# Patient Record
Sex: Female | Born: 1990 | Race: Black or African American | Hispanic: No | Marital: Single
Health system: Midwestern US, Community
[De-identification: ages and names within clinical notes are randomized; demographics above are authoritative.]

## PROBLEM LIST (undated history)

## (undated) DIAGNOSIS — K429 Umbilical hernia without obstruction or gangrene: Secondary | ICD-10-CM

---

## 2013-01-25 ENCOUNTER — Emergency Department (HOSPITAL_COMMUNITY)
Admission: EM | Admit: 2013-01-25 | Discharge: 2013-01-25 | Disposition: A | Discharge status: Home / Self Care | Payer: Medicaid Other | Source: Home / Self Care | Attending: Family Medicine | Admitting: Family Medicine

## 2013-01-25 ENCOUNTER — Encounter (HOSPITAL_COMMUNITY): Payer: Self-pay | Admitting: *Deleted

## 2013-01-25 DIAGNOSIS — H6120 Impacted cerumen, unspecified ear: Secondary | ICD-10-CM

## 2013-01-25 DIAGNOSIS — H918X9 Other specified hearing loss, unspecified ear: Secondary | ICD-10-CM

## 2013-01-25 NOTE — ED Notes (Signed)
Pt  Reports  A  Sensation  Of  R  Earache        X      3  Days      With    Sensation of  Decreased   Hearing         Pt  denys  Any  Other  Symptoms

## 2013-01-25 NOTE — ED Provider Notes (Signed)
History     CSN: [id number]  Arrival date & time 01/25/13  1400   First MD Initiated Contact with Patient 01/25/13 1402      Chief Complaint  Patient presents with  . Otalgia    (Consider location/radiation/quality/duration/timing/severity/associated sxs/prior treatment) Patient is a 22 y.o. female presenting with ear pain. The history is provided by the patient.  Otalgia Location:  Right Behind ear:  No abnormality Quality:  Pressure Severity:  Mild Onset quality:  Sudden Duration:  3 days Progression:  Unchanged Chronicity:  New Associated symptoms: hearing loss   Associated symptoms: no ear discharge   Risk factors: no chronic ear infection     History reviewed. No pertinent past medical history.  History reviewed. No pertinent past surgical history.  No family history on file.  History  Substance Use Topics  . Smoking status: Never Smoker   . Smokeless tobacco: Not on file  . Alcohol Use: No    OB History   Grav Para Term Preterm Abortions TAB SAB Ect Mult Living                  Review of Systems  Constitutional: Negative.   HENT: Positive for hearing loss and ear pain. Negative for ear discharge.     Allergies  Review of patient's allergies indicates no known allergies.  Home Medications  No current outpatient prescriptions on file.  BP 122/80  Pulse 70  Temp(Src) 98.6 F (37 C) (Oral)  Resp 18  SpO2 100%  LMP 01/04/2013  Physical Exam  Nursing note and vitals reviewed. Constitutional: She appears well-developed and well-nourished.  HENT:  Head: Normocephalic.  Left Ear: External ear normal.  Ears:  Mouth/Throat: Oropharynx is clear and moist.    ED Course  Procedures (including critical care time)  Labs Reviewed - No data to display No results found.   1. Hearing loss secondary to cerumen impaction       MDM   sx relieved after ear irrig. Tm nl canal.        Linna Hoff, MD 01/25/13 [phone]

## 2013-09-04 ENCOUNTER — Inpatient Hospital Stay (HOSPITAL_COMMUNITY)
Admission: EM | Admit: 2013-09-04 | Discharge: 2013-09-05 | DRG: 208 | Disposition: A | Discharge status: Home / Self Care | Payer: Medicaid Other | Attending: Internal Medicine | Admitting: Internal Medicine

## 2013-09-04 ENCOUNTER — Encounter (HOSPITAL_COMMUNITY): Payer: Self-pay | Admitting: Emergency Medicine

## 2013-09-04 ENCOUNTER — Emergency Department (HOSPITAL_COMMUNITY): Payer: Medicaid Other

## 2013-09-04 DIAGNOSIS — F1010 Alcohol abuse, uncomplicated: Secondary | ICD-10-CM | POA: Diagnosis present

## 2013-09-04 DIAGNOSIS — N179 Acute kidney failure, unspecified: Secondary | ICD-10-CM | POA: Diagnosis present

## 2013-09-04 DIAGNOSIS — J9600 Acute respiratory failure, unspecified whether with hypoxia or hypercapnia: Secondary | ICD-10-CM

## 2013-09-04 DIAGNOSIS — Y92009 Unspecified place in unspecified non-institutional (private) residence as the place of occurrence of the external cause: Secondary | ICD-10-CM

## 2013-09-04 DIAGNOSIS — F1210 Cannabis abuse, uncomplicated: Secondary | ICD-10-CM | POA: Diagnosis present

## 2013-09-04 DIAGNOSIS — R4020 Unspecified coma: Secondary | ICD-10-CM

## 2013-09-04 DIAGNOSIS — F1910 Other psychoactive substance abuse, uncomplicated: Secondary | ICD-10-CM | POA: Diagnosis present

## 2013-09-04 DIAGNOSIS — G9340 Encephalopathy, unspecified: Secondary | ICD-10-CM | POA: Diagnosis present

## 2013-09-04 DIAGNOSIS — IMO0002 Reserved for concepts with insufficient information to code with codable children: Secondary | ICD-10-CM

## 2013-09-04 DIAGNOSIS — W08XXXA Fall from other furniture, initial encounter: Secondary | ICD-10-CM | POA: Diagnosis present

## 2013-09-04 DIAGNOSIS — F10929 Alcohol use, unspecified with intoxication, unspecified: Secondary | ICD-10-CM

## 2013-09-04 DIAGNOSIS — J9601 Acute respiratory failure with hypoxia: Secondary | ICD-10-CM

## 2013-09-04 DIAGNOSIS — E876 Hypokalemia: Secondary | ICD-10-CM | POA: Diagnosis present

## 2013-09-04 DIAGNOSIS — R4182 Altered mental status, unspecified: Secondary | ICD-10-CM | POA: Diagnosis present

## 2013-09-04 LAB — CBC WITH DIFFERENTIAL/PLATELET
Basophils Relative: 0 % (ref 0–1)
HCT: 42 % (ref 36.0–46.0)
Hemoglobin: 15.1 g/dL — ABNORMAL HIGH (ref 12.0–15.0)
Lymphocytes Relative: 44 % (ref 12–46)
MCHC: 36 g/dL (ref 30.0–36.0)
Monocytes Absolute: 0.3 10*3/uL (ref 0.1–1.0)
Monocytes Relative: 3 % (ref 3–12)
Neutro Abs: 5.5 10*3/uL (ref 1.7–7.7)
Neutrophils Relative %: 52 % (ref 43–77)
RBC: 4.41 MIL/uL (ref 3.87–5.11)
WBC: 10.6 10*3/uL — ABNORMAL HIGH (ref 4.0–10.5)

## 2013-09-04 LAB — SALICYLATE LEVEL: Salicylate Lvl: <2 mg/dL — ABNORMAL LOW (ref 2.8–20.0)

## 2013-09-04 LAB — POCT PREGNANCY, URINE: Preg Test, Ur: NEGATIVE

## 2013-09-04 LAB — URINALYSIS, ROUTINE W REFLEX MICROSCOPIC
Hgb urine dipstick: NEGATIVE
Leukocytes, UA: NEGATIVE
Nitrite: NEGATIVE
Protein, ur: NEGATIVE mg/dL
Specific Gravity, Urine: 1.019 (ref 1.005–1.030)
Urobilinogen, UA: 0.2 mg/dL (ref 0.0–1.0)

## 2013-09-04 LAB — ACETAMINOPHEN LEVEL: Acetaminophen (Tylenol), Serum: <15 ug/mL (ref 10–30)

## 2013-09-04 LAB — COMPREHENSIVE METABOLIC PANEL
ALT: 10 U/L (ref 0–35)
Albumin: 3.3 g/dL — ABNORMAL LOW (ref 3.5–5.2)
Alkaline Phosphatase: 37 U/L — ABNORMAL LOW (ref 39–117)
BUN: 10 mg/dL (ref 6–23)
Chloride: 110 mEq/L (ref 96–112)
Glucose, Bld: 97 mg/dL (ref 70–99)
Potassium: 3.2 mEq/L — ABNORMAL LOW (ref 3.5–5.1)
Sodium: 144 mEq/L (ref 135–145)
Total Bilirubin: 0.1 mg/dL — ABNORMAL LOW (ref 0.3–1.2)
Total Protein: 6 g/dL (ref 6.0–8.3)

## 2013-09-04 LAB — BLOOD GAS, ARTERIAL
Bicarbonate: 20.9 mEq/L (ref 20.0–24.0)
PEEP: 5 cmH2O
TCO2: 19.3 mmol/L (ref 0–100)
pCO2 arterial: 54.6 mmHg — ABNORMAL HIGH (ref 35.0–45.0)
pH, Arterial: 7.208 — ABNORMAL LOW (ref 7.350–7.450)
pO2, Arterial: 375 mmHg — ABNORMAL HIGH (ref 80.0–100.0)

## 2013-09-04 LAB — RAPID URINE DRUG SCREEN, HOSP PERFORMED
Amphetamines: NOT DETECTED
Tetrahydrocannabinol: POSITIVE — AB

## 2013-09-04 LAB — ETHANOL: Alcohol, Ethyl (B): 407 mg/dL (ref 0–11)

## 2013-09-04 LAB — OSMOLALITY: Osmolality: 379 mOsm/kg (ref 275–300)

## 2013-09-04 MED ORDER — MORPHINE SULFATE 4 MG/ML IJ SOLN
INTRAMUSCULAR | Status: AC
  Filled 2013-09-04: qty 1

## 2013-09-04 MED ORDER — DEXTROSE-NACL 5-0.45 % IV SOLN
INTRAVENOUS | Status: DC
  Administered 2013-09-04 – 2013-09-05 (×2): via INTRAVENOUS

## 2013-09-04 MED ORDER — PROPOFOL 10 MG/ML IV EMUL
5.0000 ug/kg/min | Freq: Once | INTRAVENOUS | Status: AC
  Administered 2013-09-04: 5 ug/kg/min via INTRAVENOUS
  Filled 2013-09-04: qty 100

## 2013-09-04 MED ORDER — NALOXONE HCL 1 MG/ML IJ SOLN
INTRAMUSCULAR | Status: AC
  Filled 2013-09-04: qty 2

## 2013-09-04 MED ORDER — MIDAZOLAM HCL 2 MG/2ML IJ SOLN
5.0000 mg | Freq: Once | INTRAMUSCULAR | Status: DC
  Filled 2013-09-04: qty 6

## 2013-09-04 MED ORDER — NALOXONE HCL 1 MG/ML IJ SOLN
2.0000 mg | Freq: Once | INTRAMUSCULAR | Status: AC
  Administered 2013-09-04: 2 mg via INTRAVENOUS

## 2013-09-04 MED ORDER — MORPHINE SULFATE 4 MG/ML IJ SOLN
4.0000 mg | Freq: Once | INTRAMUSCULAR | Status: AC
  Administered 2013-09-04: 4 mg via INTRAVENOUS

## 2013-09-04 MED ORDER — LORAZEPAM 2 MG/ML IJ SOLN
4.0000 mg | Freq: Once | INTRAMUSCULAR | Status: AC
  Administered 2013-09-04: 4 mg via INTRAVENOUS

## 2013-09-04 MED ORDER — SODIUM CHLORIDE 0.9 % IV SOLN: 250.0000 mL | INTRAVENOUS | Status: DC | PRN

## 2013-09-04 MED ORDER — PANTOPRAZOLE SODIUM 40 MG IV SOLR: 40.0000 mg | Freq: Every day | INTRAVENOUS | Status: DC

## 2013-09-04 MED ORDER — POTASSIUM CHLORIDE 10 MEQ/100ML IV SOLN
10.0000 meq | INTRAVENOUS | Status: AC
  Administered 2013-09-04 (×4): 10 meq via INTRAVENOUS
  Filled 2013-09-04 (×4): qty 100

## 2013-09-04 MED ORDER — NALOXONE HCL 1 MG/ML IJ SOLN: 2.0000 mg | Freq: Once | INTRAMUSCULAR | Status: DC

## 2013-09-04 MED ORDER — MENTHOL 3 MG MT LOZG
1.0000 | LOZENGE | OROMUCOSAL | Status: DC | PRN
  Administered 2013-09-04: 3 mg via ORAL
  Filled 2013-09-04: qty 9

## 2013-09-04 MED ORDER — LORAZEPAM 2 MG/ML IJ SOLN
INTRAMUSCULAR | Status: AC
  Filled 2013-09-04: qty 2

## 2013-09-04 MED ORDER — HEPARIN SODIUM (PORCINE) 5000 UNIT/ML IJ SOLN
5000.0000 [IU] | Freq: Three times a day (TID) | INTRAMUSCULAR | Status: DC
  Administered 2013-09-04 – 2013-09-05 (×3): 5000 [IU] via SUBCUTANEOUS
  Filled 2013-09-04 (×8): qty 1

## 2013-09-04 NOTE — ED Provider Notes (Addendum)
CSN: [id number]     Arrival date & time 09/04/13  0153 History   First MD Initiated Contact with Patient 09/04/13 0156     Chief Complaint  Patient presents with  . Alcohol Intoxication   (Consider location/radiation/quality/duration/timing/severity/associated sxs/prior Treatment) HPI Comments: LEVEL 5 CAVEAT FOR ALTERED MENTAL STATUS. Pt comes in with cc of fall. Per EMS, family called EMS after she fel from couch onto a the floor - and wouldn't move. Family immersed patient in a bath, and she still didn't respond, so they called EMS> Pt was drinking liquor prior to episode. Family denies any hx of drug abuse and denies any medical problems that they are aware of.   Patient is a 22 y.o. female presenting with intoxication. The history is provided by the EMS personnel and a relative.  Alcohol Intoxication    History reviewed. No pertinent past medical history. History reviewed. No pertinent past surgical history. History reviewed. No pertinent family history. History  Substance Use Topics  . Smoking status: Never Smoker   . Smokeless tobacco: Not on file  . Alcohol Use: Yes   OB History   Grav Para Term Preterm Abortions TAB SAB Ect Mult Living                 Review of Systems  Unable to perform ROS: Patient unresponsive    Allergies  Review of patient's allergies indicates no known allergies.  Home Medications  No current outpatient prescriptions on file. BP 102/78  Pulse 110  Resp 23  Ht 5\' 5"  (1.651 m)  Wt 120 lb (54.432 kg)  BMI 19.97 kg/m2  SpO2 100% Physical Exam  Nursing note and vitals reviewed. Constitutional: She appears well-developed.  HENT:  Head: Atraumatic.  Eyes:  Pinpoint, no saccadic movement, or nystagmus  Cardiovascular: Normal rate.   Pulmonary/Chest: Effort normal.  Abdominal: Soft.  Neurological:  GCS - 1/1/3 (e/v/m) = 5. No gag  Skin: Skin is warm.    ED Course  Gastrostomy tube replacement Date/Time: 09/04/2013 2:43  AM Performed by: Derwood Kaplan Authorized by: Derwood Kaplan Consent: The procedure was performed in an emergent situation. Patient identity confirmed: arm band Time out: Immediately prior to procedure a "time out" was called to verify the correct patient, procedure, equipment, support staff and site/side marked as required. Preparation: Patient was prepped and draped in the usual sterile fashion. Local anesthesia used: no Patient tolerance: Patient tolerated the procedure well with no immediate complications. Comments: OROGASTRIC TUBE PLACED POST INTUBATION   (including critical care time) Labs Review Labs Reviewed  CBC WITH DIFFERENTIAL  TROPONIN I  COMPREHENSIVE METABOLIC PANEL  URINE RAPID DRUG SCREEN (HOSP PERFORMED)  ACETAMINOPHEN LEVEL  SALICYLATE LEVEL  ETHANOL  BLOOD GAS, ARTERIAL  OSMOLALITY   Imaging Review No results found.  EKG Interpretation    Date/Time:  Sunday September 04 2013 02:21:14 EST Ventricular Rate:  116 PR Interval:  171 QRS Duration: 59 QT Interval:  322 QTC Calculation: 447 R Axis:   88 Text Interpretation:  Sinus tachycardia Abnormal Q suggests anterior infarct Confirmed by Xavier Munger, MD, Anacristina Steffek ([id number]) on 09/04/2013 2:35:50 AM            MDM  No diagnosis found.   Date: 09/04/2013  Rate: 116  Rhythm: sinus tachycardia  QRS Axis: normal  Intervals: normal  ST/T Wave abnormalities: nonspecific ST/T changes  Conduction Disutrbances:none  Narrative Interpretation:   Old EKG Reviewed: none available   DDx includes: ICH Stroke ACS Electrolyte abnormality Drug overdose Metabolic  disorders including thyroid disorders, adrenal insufficiency Hypercapnia Hypoxia  Pt comes in comatose. No medical hx. Reported moderate alcohol consumption. No drug abuse. Pt at arrival had pin point pupils - 2 mg narcan x 2 given - no response. Pt was intubated.   INTUBATION Performed by: Derwood Kaplan  Required items: required blood  products, implants, devices, and special equipment available Patient identity confirmed: provided demographic data and hospital-assigned identification number Time out: Immediately prior to procedure a "time out" was called to verify the correct patient, procedure, equipment, support staff and site/side marked as required.  Indications: Airway Protection  Intubation method: Direct Laryngoscopy   Preoxygenation: BVM  Sedatives: 15 mg Etomidate Paralytic: 80 mg Succinylcholine  Tube Size: 7 cuffed  Post-procedure assessment: chest rise and ETCO2 monitor Breath sounds: equal and absent over the epigastrium Tube secured with: ETT holder Chest x-ray interpreted by radiologist and me.  Chest x-ray findings: endotracheal tube in appropriate position  Patient tolerated the procedure well with no immediate complications.      Derwood Kaplan, MD 09/04/13 0244  CRITICAL CARE Performed by: Derwood Kaplan   Total critical care time: 35 minutes  Critical care time was exclusive of separately billable procedures and treating other patients.  Critical care was necessary to treat or prevent imminent or life-threatening deterioration.  Critical care was time spent personally by me on the following activities: development of treatment plan with patient and/or surrogate as well as nursing, discussions with consultants, evaluation of patient's response to treatment, examination of patient, obtaining history from patient or surrogate, ordering and performing treatments and interventions, ordering and review of laboratory studies, ordering and review of radiographic studies, pulse oximetry and re-evaluation of patient's condition.   Derwood Kaplan, MD 09/04/13 [phone]

## 2013-09-04 NOTE — H&P (Signed)
PULMONARY  / CRITICAL CARE MEDICINE  Name: Angelica Cummings MRN: [id number] DOB: August 10, 1991    ADMISSION DATE:  09/04/2013  CHIEF COMPLAINT:   Alcohol intoxication Respiratory failure.   BRIEF PATIENT DESCRIPTION:  22 years old female with no significant PMH. Presents with AMS secondary to alcohol intoxication and hypercarbic respiratory failure requiring intubation.   SIGNIFICANT EVENTS / STUDIES:  - Chest X ray clear.  LINES / TUBES: - Peripheral IV's  CULTURES: No cultures  ANTIBIOTICS: No antibiotics  HISTORY OF PRESENT ILLNESS:   22 years old female with no significant PMH. Presents with AMS secondary to alcohol intoxication and hypercarbic respiratory failure requiring intubation. Apparently she was drinking with friends and when she got home she became more unresponsive. Intubated in the ED due to inability to protect her airway and hypercarbic failure. At the time of my exam the patient is sedated, hemodynamically stable, riding the vent.   PAST MEDICAL HISTORY :  History reviewed. No pertinent past medical history. History reviewed. No pertinent past surgical history. Prior to Admission medications   Not on File   No Known Allergies  FAMILY HISTORY:  History reviewed. No pertinent family history. SOCIAL HISTORY:  reports that she has never smoked. She does not have any smokeless tobacco history on file. She reports that she drinks alcohol. She reports that she does not use illicit drugs.  REVIEW OF SYSTEMS:  Unable to provide  SUBJECTIVE:   VITAL SIGNS: Pulse Rate:  [87-116] 110 (11/23 0234) Resp:  [23-26] 23 (11/23 0234) BP: (102-117)/(67-78) 102/78 mmHg (11/23 0234) SpO2:  [96 %-100 %] 100 % (11/23 0234) FiO2 (%):  [50 %-100 %] 100 % (11/23 0234) Weight:  [120 lb (54.432 kg)] 120 lb (54.432 kg) (11/23 0234) HEMODYNAMICS:   VENTILATOR SETTINGS: Vent Mode:  [-] PRVC FiO2 (%):  [50 %-100 %] 100 % Set Rate:  [14 bmp] 14 bmp Vt Set:  [450 mL] 450  mL PEEP:  [5 cmH20] 5 cmH20 Plateau Pressure:  [18 cmH20] 18 cmH20 INTAKE / OUTPUT: Intake/Output   None     PHYSICAL EXAMINATION: General: No apparent distress. Eyes: Anicteric sclerae. ENT: ETT in place Lymph: No cervical, supraclavicular, or axillary lymphadenopathy. Heart: Normal S1, S2. No murmurs, rubs, or gallops appreciated. No bruits, equal pulses. Lungs: Normal excursion, no dullness to percussion. Good air movement bilaterally, without wheezes or crackles. Abdomen: Abdomen soft, non-tender and not distended, normoactive bowel sounds. No hepatosplenomegaly or masses. Musculoskeletal: No clubbing or synovitis. Skin: No rashes or lesions Neuro: Sedated with propofol   LABS:  CBC  Recent Labs Lab 09/04/13 0150  WBC 10.6*  HGB 15.1*  HCT 42.0  PLT 234   Coag's No results found for this basename: APTT, INR,  in the last 168 hours BMET  Recent Labs Lab 09/04/13 0150  NA 144  K 3.2*  CL 110  CO2 24  BUN 10  CREATININE 0.76  GLUCOSE 97   Electrolytes  Recent Labs Lab 09/04/13 0150  CALCIUM 8.4   Sepsis Markers No results found for this basename: LATICACIDVEN, PROCALCITON, O2SATVEN,  in the last 168 hours ABG  Recent Labs Lab 09/04/13 0242  PHART 7.208*  PCO2ART 54.6*  PO2ART 375.0*   Liver Enzymes  Recent Labs Lab 09/04/13 0150  AST 17  ALT 10  ALKPHOS 37*  BILITOT 0.1*  ALBUMIN 3.3*   Cardiac Enzymes  Recent Labs Lab 09/04/13 0150  TROPONINI <0.30   Glucose No results found for this basename: GLUCAP,  in the last  168 hours  Imaging Ct Head Wo Contrast  09/04/2013   CLINICAL DATA:  Altered mental status.  Ethanol use.  EXAM: CT HEAD WITHOUT CONTRAST  TECHNIQUE: Contiguous axial images were obtained from the base of the skull through the vertex without intravenous contrast.  COMPARISON:  None.  FINDINGS: Skull and Sinuses:Nasopharyngeal fluid related to intubation. No acute osseous findings. Mild ethmoid sinus mucosal  thickening bilaterally.  Orbits: No acute abnormality.  Brain: No evidence of acute abnormality, such as acute infarction, hemorrhage, hydrocephalus, or mass lesion/mass effect.  IMPRESSION: No evidence of acute intracranial disease.   Electronically Signed   By: Tiburcio Pea M.D.   On: 09/04/2013 03:17   Dg Chest Port 1 View  09/04/2013   CLINICAL DATA:  ETT placement.  EXAM: PORTABLE CHEST - 1 VIEW  COMPARISON:  None.  FINDINGS: Endotracheal tube ends 1-2 cm above the carina. Gastric suction tube enters the stomach.  Clear lungs.  Normal heart size.  No effusion or pneumothorax.  IMPRESSION: 1. Orogastric and endotracheal tubes in satisfactory position. 2. Clear lungs.   Electronically Signed   By: Tiburcio Pea M.D.   On: 09/04/2013 03:02     CXR: Clear  PULMONARY  A:  1) Acute respiratory failure due to inability to protect his airway.  P:  - Mechanical ventilation  - PRVC, Vt: 8cc/kg, PEEP: 5, RR: 16, FiO2: 100% and adjust to keep O2 sat > 94%  - VAP prevention order set  - Daily awakening and SBT   CARDIOVASCULAR  A:  1) Hemodynamically stable.  P:  - ICU monitoring   RENAL  A:  1) Hypokalemia  2) Acute renal failure  P:  - Will replace K  - Follow Up CMP   GASTROINTESTINAL  A:  1) No issues  P:  - IV PPI   HEMATOLOGIC  A:  1) No issues  P:  - Will follow CBC   INFECTIOUS  A:  1) No evidence of acute infection  P:  - No antibiotics   ENDOCRINE  A:  1) No issues   NEUROLOGIC  A:  1) Sedated  P:  - Propofol  - Fentanyl  - Goal RASS 0  I have personally obtained a history, examined the patient, evaluated laboratory and imaging results, formulated the assessment and plan and placed orders. CRITICAL CARE: Critical Care Time devoted to patient care services described in this note is 45 minutes.   Overton Mam, MD Pulmonary and Critical Care Medicine [facility] Pager: [phone]  09/04/2013, 5:17 AM

## 2013-09-04 NOTE — Progress Notes (Signed)
PULMONARY  / CRITICAL CARE MEDICINE  Name: Angelica Cummings MRN: [id number] DOB: November 07, 1990    ADMISSION DATE:  09/04/2013  CHIEF COMPLAINT:   Alcohol intoxication Respiratory failure.   BRIEF PATIENT DESCRIPTION:  22 years old female with no significant PMH. Presents with AMS secondary to alcohol intoxication and hypercarbic respiratory failure requiring intubation.   SIGNIFICANT EVENTS / STUDIES:  - Chest X ray clear.  LINES / TUBES: - Peripheral IV's  CULTURES: No cultures  ANTIBIOTICS: No antibiotics  HISTORY OF PRESENT ILLNESS:   22 years old female with no significant PMH. Presents with AMS secondary to alcohol intoxication and hypercarbic respiratory failure requiring intubation. Apparently she was drinking with friends and when she got home she became more unresponsive. Intubated in the ED due to inability to protect her airway and hypercarbic failure. At the time of my exam the patient is sedated, hemodynamically stable, riding the vent.    SUBJECTIVE:   09/04/13 repeat rounds: WUA in progress. Opioids in urine along with MJ and lethal alcohiol level;  VITAL SIGNS: Temp:  [100 F (37.8 C)-100.4 F (38 C)] 100.4 F (38 C) (11/23 0730) Pulse Rate:  [87-116] 97 (11/23 0900) Resp:  [16-27] 16 (11/23 0900) BP: (102-133)/(65-86) 130/83 mmHg (11/23 0900) SpO2:  [96 %-100 %] 100 % (11/23 0900) FiO2 (%):  [40 %-100 %] 40 % (11/23 0900) Weight:  [54.432 kg (120 lb)] 54.432 kg (120 lb) (11/23 0234) HEMODYNAMICS:   VENTILATOR SETTINGS: Vent Mode:  [-] PRVC FiO2 (%):  [40 %-100 %] 40 % Set Rate:  [14 bmp-16 bmp] 16 bmp Vt Set:  [450 mL] 450 mL PEEP:  [5 cmH20] 5 cmH20 Plateau Pressure:  [17 cmH20-18 cmH20] 17 cmH20 INTAKE / OUTPUT: Intake/Output     11/22 0701 - 11/23 0700 11/23 0701 - 11/24 0700   I.V. (mL/kg) 20 (0.4) 49.4 (0.9)   IV Piggyback 100 300   Total Intake(mL/kg) 120 (2.2) 349.4 (6.4)   Urine (mL/kg/hr) 500 100 (0.7)   Total Output 500 100   Net  -380 +249.4          PHYSICAL EXAMINATION: General: No apparent distress. Eyes: Anicteric sclerae. ENT: ETT in place Lymph: No cervical, supraclavicular, or axillary lymphadenopathy. Heart: Normal S1, S2. No murmurs, rubs, or gallops appreciated. No bruits, equal pulses. Lungs: Normal excursion, no dullness to percussion. Good air movement bilaterally, without wheezes or crackles. Abdomen: Abdomen soft, non-tender and not distended, normoactive bowel sounds. No hepatosplenomegaly or masses. Musculoskeletal: No clubbing or synovitis. Skin: No rashes or lesions Neuro: Sedated with propofol . WUA in progress  LABS:  PULMONARY  Recent Labs Lab 09/04/13 0242  PHART 7.208*  PCO2ART 54.6*  PO2ART 375.0*  HCO3 20.9  TCO2 19.3  O2SAT 98.8    CBC  Recent Labs Lab 09/04/13 0150  HGB 15.1*  HCT 42.0  WBC 10.6*  PLT 234    COAGULATION No results found for this basename: INR,  in the last 168 hours  CARDIAC   Recent Labs Lab 09/04/13 0150  TROPONINI <0.30   No results found for this basename: PROBNP,  in the last 168 hours   CHEMISTRY  Recent Labs Lab 09/04/13 0150  NA 144  K 3.2*  CL 110  CO2 24  GLUCOSE 97  BUN 10  CREATININE 0.76  CALCIUM 8.4   Estimated Creatinine Clearance: 93.1 ml/min (by C-G formula based on Cr of 0.76).   LIVER  Recent Labs Lab 09/04/13 0150  AST 17  ALT 10  ALKPHOS 37*  BILITOT 0.1*  PROT 6.0  ALBUMIN 3.3*     INFECTIOUS No results found for this basename: LATICACIDVEN, PROCALCITON,  in the last 168 hours   ENDOCRINE CBG (last 3)  No results found for this basename: GLUCAP,  in the last 72 hours       IMAGING x48h  Ct Head Wo Contrast  09/04/2013   CLINICAL DATA:  Altered mental status.  Ethanol use.  EXAM: CT HEAD WITHOUT CONTRAST  TECHNIQUE: Contiguous axial images were obtained from the base of the skull through the vertex without intravenous contrast.  COMPARISON:  None.  FINDINGS: Skull and  Sinuses:Nasopharyngeal fluid related to intubation. No acute osseous findings. Mild ethmoid sinus mucosal thickening bilaterally.  Orbits: No acute abnormality.  Brain: No evidence of acute abnormality, such as acute infarction, hemorrhage, hydrocephalus, or mass lesion/mass effect.  IMPRESSION: No evidence of acute intracranial disease.   Electronically Signed   By: Tiburcio Pea M.D.   On: 09/04/2013 03:17   Dg Chest Port 1 View  09/04/2013   CLINICAL DATA:  ETT placement.  EXAM: PORTABLE CHEST - 1 VIEW  COMPARISON:  None.  FINDINGS: Endotracheal tube ends 1-2 cm above the carina. Gastric suction tube enters the stomach.  Clear lungs.  Normal heart size.  No effusion or pneumothorax.  IMPRESSION: 1. Orogastric and endotracheal tubes in satisfactory position. 2. Clear lungs.   Electronically Signed   By: Tiburcio Pea M.D.   On: 09/04/2013 03:02       CXR: Clear  PULMONARY  A:  1) Acute respiratory failure due to inability to protect his airway.   09/04/13: repeat rounds. WUA in progress P:  - extubate if awake and pases SBT:" opiates in urine so need to be cautious - Mechanical ventilation  - PRVC, Vt: 8cc/kg, PEEP: 5, RR: 16, FiO2: 100% and adjust to keep O2 sat > 94%  - VAP prevention order set  - Daily awakening and SBT   CARDIOVASCULAR  A:  1) Hemodynamically stable.  P:  - ICU monitoring   RENAL  A:  1) Hypokalemia  2) Acute renal failure  P:  - Will replace K  - Follow Up CMP   GASTROINTESTINAL  A:  1) No issues  P:  - IV PPI   HEMATOLOGIC  A:  1) No issues  P:  - Will follow CBC   INFECTIOUS  A:  1) No evidence of acute infection  P:  - No antibiotics   ENDOCRINE  A:  1) No issues   NEUROLOGIC  A:  1) Sedated  P:  - Propofol  - Fentanyl  - Goal RASS 0   GLOBAL 09/04/13: repeat rounds: mom, sister, dad and cousin updated. Possible extubation today but with opiates in system need to be cautious    The patient is critically ill with  multiple organ systems failure and requires high complexity decision making for assessment and support, frequent evaluation and titration of therapies, application of advanced monitoring technologies and extensive interpretation of multiple databases.   Critical Care Time devoted to patient care services described in this note is  31  Minutes.  Dr. Kalman Shan, M.D., [facility].C.P Pulmonary and Critical Care Medicine Staff Physician [facility] [place] [facility] Pager: [phone], If no answer or between  15:00h - 7:00h: call [phone]  09/04/2013 9:40 AM

## 2013-09-04 NOTE — Progress Notes (Signed)
Pt extubated to 2L nasal cannula after approximately one and half hours on cpap mode 5/5. O2 saturations are 98% heart rate 128 respiratory rate 18.

## 2013-09-04 NOTE — ED Notes (Signed)
Per EMS pt was being carried down stairs by dad, family reported pt had been out drinking with friends and had a few shots. Initially pt was sitting on coach and fell over striking head on floor and has been passed out since then. Upon assessment by EMS pt grimaced to pain but was not alert in route pt pupils were no longer reactive and response to stimuli had decreased. 2mg  of narcan administered with no response.

## 2013-09-04 NOTE — Progress Notes (Signed)
Pt transported from ED to ICU 1234 without incident.  RT to monitor and assess as needed.

## 2013-09-04 NOTE — ED Notes (Signed)
Bed: RESA Expected date: 09/04/13 Expected time: 1:34 AM Means of arrival: Ambulance Comments: etoh

## 2013-09-05 DIAGNOSIS — J9600 Acute respiratory failure, unspecified whether with hypoxia or hypercapnia: Principal | ICD-10-CM

## 2013-09-05 DIAGNOSIS — R4020 Unspecified coma: Secondary | ICD-10-CM

## 2013-09-05 DIAGNOSIS — F1010 Alcohol abuse, uncomplicated: Secondary | ICD-10-CM

## 2013-09-05 LAB — BASIC METABOLIC PANEL
BUN: 8 mg/dL (ref 6–23)
CO2: 23 mEq/L (ref 19–32)
Chloride: 108 mEq/L (ref 96–112)
GFR calc non Af Amer: >90 mL/min (ref >90)
Glucose, Bld: 101 mg/dL — ABNORMAL HIGH (ref 70–99)
Potassium: 3.4 mEq/L — ABNORMAL LOW (ref 3.5–5.1)
Sodium: 137 mEq/L (ref 135–145)

## 2013-09-05 LAB — CBC WITH DIFFERENTIAL/PLATELET
Eosinophils Absolute: 0.1 10*3/uL (ref 0.0–0.7)
Eosinophils Relative: 1 % (ref 0–5)
HCT: 33.4 % — ABNORMAL LOW (ref 36.0–46.0)
Hemoglobin: 11.7 g/dL — ABNORMAL LOW (ref 12.0–15.0)
Lymphocytes Relative: 32 % (ref 12–46)
Lymphs Abs: 3.3 10*3/uL (ref 0.7–4.0)
MCH: 33.6 pg (ref 26.0–34.0)
MCV: 96 fL (ref 78.0–100.0)
Monocytes Relative: 8 % (ref 3–12)
Neutrophils Relative %: 59 % (ref 43–77)
Platelets: 188 10*3/uL (ref 150–400)
RBC: 3.48 MIL/uL — ABNORMAL LOW (ref 3.87–5.11)
WBC: 10.1 10*3/uL (ref 4.0–10.5)

## 2013-09-05 LAB — MAGNESIUM: Magnesium: 1.7 mg/dL (ref 1.5–2.5)

## 2013-09-05 MED ORDER — MENTHOL 3 MG MT LOZG: 1.0000 | LOZENGE | OROMUCOSAL | Status: DC | PRN

## 2013-09-05 NOTE — Progress Notes (Signed)
Clinical Social Work Department BRIEF PSYCHOSOCIAL ASSESSMENT 09/05/2013  Patient:  Angelica Cummings, Angelica Cummings     Account Number:  [id number]     Admit date:  09/04/2013  Clinical Social Worker:  Dennison Bulla  Date/Time:  09/05/2013 02:00 PM  Referred by:  Physician  Date Referred:  09/05/2013 Referred for  Substance Abuse   Other Referral:   Interview type:  Patient Other interview type:    PSYCHOSOCIAL DATA Living Status:  FAMILY Admitted from facility:   Level of care:   Primary support name:  Riley Lam Primary support relationship to patient:  PARENT Degree of support available:   Adequate    CURRENT CONCERNS Current Concerns  Substance Abuse   Other Concerns:    SOCIAL WORK ASSESSMENT / PLAN CSW received referral to assist with substance abuse resources. CSW reviewed chart and met with patient alone at bedside. CSW introduced myself and explained role.    Patient reports she is ready to DC and needs to leave in order to get her young dtr from preschool. Patient reports she was out over the weekend and drank alcohol with friends. Patient reports she is a social drinker and likes to drink alcohol only on the weekends. CSW inquired about triggers or emotional connections to alcohol use. Patient reports no MH concerns and states she enjoys drinking. Patient denies that she has any SA addiction and politely declines to complete SBIRT. Patient does acknowledge that alcohol use contributed to hospital admission and agreeable to reduce alcohol consumption.    Patient declines any substance abuse resources and reports she needs to focus her time on family. CSW tried to reinforce importance to remain sober. Patient continues to report she needs to leave in order to pick up dtr. CSW is signing off but available if further needs arise.   Assessment/plan status:  No Further Intervention Required Other assessment/ plan:   Patient declined to complete SBIRT   Information/referral to  community resources:   Patient declined SA resources    PATIENT'S/FAMILY'S RESPONSE TO PLAN OF CARE: Patient alert and oriented but guarded throughout assessment. Patient appears to minimize substance use and effects of consumption. Patient is adamant about leaving in order to get dtr and refuses all resources.       [place], [place] [phone]

## 2013-09-05 NOTE — Discharge Summary (Signed)
Physician Discharge Summary  Patient ID: Angelica Cummings MRN: [id number] DOB/AGE: Dec 20, 1990 22 y.o.  Admit date: 09/04/2013 Discharge date: 09/05/2013    Discharge Diagnoses:  ETOH Intoxication Polysubstance Abuse Acute Hypercarbic Respiratory Failure Hypokalemia Acute Kidney Injury Acute Encephalopathy                                                                      DISCHARGE PLAN BY DIAGNOSIS     ETOH Intoxication Polysubstance Abuse  Discharge Plan: -patient given information on substance abuse counseling if willing to go -encouraged to stop drinking / drug use -can follow up with [facility] for primary care needs   Acute Hypercarbic Respiratory Failure  Discharge Plan: -Resolved, no further follow up.  -PRN warm salt gargle & Cepacol for sore throat  Hypokalemia Acute Kidney Injury  Discharge Plan: -Resolved, no further follow up  Acute Encephalopathy   Discharge Plan: -Resolved, no further follow up.                   DISCHARGE SUMMARY   Angelica Cummings is a 22 y.o. y/o female with no significant PMH who presented to [facility] ER on 11/23 with altered mental status via EMS.  She was found on the floor after falling off the couch.  She did not respond to Narcan administration. CT of head performed without acute abnormality.  Patient was found to have ETOH intoxication with hypercarbic respiratory failure requiring intubation in the ER.  She had reportedly been drinking with friends and became more unresponsive at home.  UDS positive for opiates and marijuana.  Patient was admitted to ICU.  PCXR without evidence of acute infiltrate or aspiration.  No hemodynamic instability noted during admit. EKG within normal limits.  Acute kidney injury & hypokalemia noted during hospital course.  Potassium repleted.  11/23 she was weaned & passed an SBT with successful extubation.  She required no further respiratory interventions.  Treated with Cepacol for sore  throat post extubation.  Serum creatinine returned to baseline prior to discharge.  Patient able to ambulate and provide self care prior to discharge.  She was counseled extensively and given information regarding outpatient counseling for substance / alcohol abuse.               SIGNIFICANT DIAGNOSTIC STUDIES 11/23 - CT HEAD>>>No evidence of acute intracranial disease   MICRO DATA  MRSA PCR 11/23>>>neg   Discharge Exam: General: wdwn young adult female in NAD Neuro:AAOx4, speech clear, MAE CV: s1s2 rrr, no m/r/g PULM: resp's even/non-labored on RA, lungs bilaterally clear GI: abd round/soft, bsx4 active Extremities: warm/dry, no edema  Filed Vitals:   09/04/13 1802 09/04/13 2228 09/05/13 0512 09/05/13 0630  BP: 119/65 108/64  96/57  Pulse: 99 84  84  Temp: 100.3 F (37.9 C) 99.3 F (37.4 C)  98.9 F (37.2 C)  TempSrc: Oral Oral  Oral  Resp: 16 16  16   Height: 5\' 6"  (1.676 m)     Weight: 115 lb 6.4 oz (52.345 kg)  118 lb 11.2 oz (53.842 kg)   SpO2: 92% 100%  93%   Discharge Labs  BMET  Recent Labs Lab 09/04/13 0150 09/05/13 0523  NA 144 137  K 3.2* 3.4*  CL 110 108  CO2 24 23  GLUCOSE 97 101*  BUN 10 8  CREATININE 0.76 0.71  CALCIUM 8.4 8.2*  MG  --  1.7  PHOS  --  3.1   CBC  Recent Labs Lab 09/04/13 0150 09/05/13 0523  HGB 15.1* 11.7*  HCT 42.0 33.4*  WBC 10.6* 10.1  PLT 234 188    Anti-Coagulation No results found for this basename: INR,  in the last 168 hours  Discharge Orders   Future Orders Complete By Expires   Call MD for:  difficulty breathing, headache or visual disturbances  As directed    Call MD for:  extreme fatigue  As directed    Call MD for:  persistant dizziness or light-headedness  As directed    Call MD for:  temperature >100.4  As directed    Diet general  As directed    Discharge instructions  As directed    Comments:     1.  Use salt water gargle for sore throat & Cepacol as needed. 2. STOP DRINKING!   Increase  activity slowly  As directed          Follow-up Information   Follow up with [facility] [place]. (As needed for primary care. )    Contact information:   [address] [place] [address] [phone]       Medication List         menthol-cetylpyridinium 3 MG lozenge  Commonly known as:  CEPACOL  Take 1 lozenge (3 mg total) by mouth as needed for sore throat.        Disposition: Home.  No home health needs identified prior to discharge.   Discharged Condition: Angelica Cummings has met maximum benefit of inpatient care and is medically stable and cleared for discharge.  Patient is pending follow up as above.      Time spent on disposition:  Greater than 35 minutes.   Signed: Canary Brim, NP-C [facility] Pgr: [phone] Office: [phone]

## 2013-09-05 NOTE — Progress Notes (Signed)
Patient being d/c home with family. IV cath removed and intact. No pain/swelling at site. Prescriptions and instructions given to patient. Patient getting dressed and awaiting ride.

## 2013-09-06 NOTE — Discharge Summary (Signed)
Angelica Sant, MD Pulmonary and Critical Care Medicine [facility] Pager: [phone]  

## 2015-04-09 ENCOUNTER — Emergency Department (HOSPITAL_COMMUNITY)
Admission: EM | Admit: 2015-04-09 | Discharge: 2015-04-09 | Disposition: A | Discharge status: Home / Self Care | Payer: Self-pay | Attending: Emergency Medicine | Admitting: Emergency Medicine

## 2015-04-09 ENCOUNTER — Encounter (HOSPITAL_COMMUNITY): Payer: Self-pay | Admitting: Family Medicine

## 2015-04-09 DIAGNOSIS — R21 Rash and other nonspecific skin eruption: Secondary | ICD-10-CM

## 2015-04-09 DIAGNOSIS — L259 Unspecified contact dermatitis, unspecified cause: Secondary | ICD-10-CM | POA: Insufficient documentation

## 2015-04-09 MED ORDER — HYDROCORTISONE 2.5 % EX CREA: TOPICAL_CREAM | Freq: Two times a day (BID) | CUTANEOUS | Status: DC | PRN

## 2015-04-09 NOTE — Discharge Instructions (Signed)
Use benadryl and hydrocortisone cream for itching or rash, and continue your usual home medications. Get plenty of rest, drink plenty of fluids, use cetaphil cleanser or dove soap; use eucerin or aquaphor lotion after every bath/shower; and avoid any known triggers (perfumes, foods). Please followup with [place] [facility] in 1 week for recheck of symptoms, and to establish medical care. Return to the ER for changes or worsening symptoms.   Contact Dermatitis Contact dermatitis is a reaction to certain substances that touch the skin. Contact dermatitis can be either irritant contact dermatitis or allergic contact dermatitis. Irritant contact dermatitis does not require previous exposure to the substance for a reaction to occur.Allergic contact dermatitis only occurs if you have been exposed to the substance before. Upon a repeat exposure, your body reacts to the substance.  CAUSES  Many substances can cause contact dermatitis. Irritant dermatitis is most commonly caused by repeated exposure to mildly irritating substances, such as:  Makeup.  Soaps.  Detergents.  Bleaches.  Acids.  Metal salts, such as nickel. Allergic contact dermatitis is most commonly caused by exposure to:  Poisonous plants.  Chemicals (deodorants, shampoos).  Jewelry.  Latex.  Neomycin in triple antibiotic cream.  Preservatives in products, including clothing. SYMPTOMS  The area of skin that is exposed may develop:  Dryness or flaking.  Redness.  Cracks.  Itching.  Pain or a burning sensation.  Blisters. With allergic contact dermatitis, there may also be swelling in areas such as the eyelids, mouth, or genitals.  DIAGNOSIS  Your caregiver can usually tell what the problem is by doing a physical exam. In cases where the cause is uncertain and an allergic contact dermatitis is suspected, a patch skin test may be performed to help determine the cause of your dermatitis. TREATMENT Treatment  includes protecting the skin from further contact with the irritating substance by avoiding that substance if possible. Barrier creams, powders, and gloves may be helpful. Your caregiver may also recommend:  Steroid creams or ointments applied 2 times daily. For best results, soak the rash area in cool water for 20 minutes. Then apply the medicine. Cover the area with a plastic wrap. You can store the steroid cream in the refrigerator for a "chilly" effect on your rash. That may decrease itching. Oral steroid medicines may be needed in more severe cases.  Antibiotics or antibacterial ointments if a skin infection is present.  Antihistamine lotion or an antihistamine taken by mouth to ease itching.  Lubricants to keep moisture in your skin.  Burow's solution to reduce redness and soreness or to dry a weeping rash. Mix one packet or tablet of solution in 2 cups cool water. Dip a clean washcloth in the mixture, wring it out a bit, and put it on the affected area. Leave the cloth in place for 30 minutes. Do this as often as possible throughout the day.  Taking several cornstarch or baking soda baths daily if the area is too large to cover with a washcloth. Harsh chemicals, such as alkalis or acids, can cause skin damage that is like a burn. You should flush your skin for 15 to 20 minutes with cold water after such an exposure. You should also seek immediate medical care after exposure. Bandages (dressings), antibiotics, and pain medicine may be needed for severely irritated skin.  HOME CARE INSTRUCTIONS  Avoid the substance that caused your reaction.  Keep the area of skin that is affected away from hot water, soap, sunlight, chemicals, acidic substances, or  anything else that would irritate your skin.  Do not scratch the rash. Scratching may cause the rash to become infected.  You may take cool baths to help stop the itching.  Only take over-the-counter or prescription medicines as directed by  your caregiver.  See your caregiver for follow-up care as directed to make sure your skin is healing properly. SEEK MEDICAL CARE IF:   Your condition is not better after 3 days of treatment.  You seem to be getting worse.  You see signs of infection such as swelling, tenderness, redness, soreness, or warmth in the affected area.  You have any problems related to your medicines. Document Released: 09/26/2000 Document Revised: 12/22/2011 Document Reviewed: 03/04/2011 [facility] Patient Information 2015 [place], [place]. This information is not intended to replace advice given to you by your health care provider. Make sure you discuss any questions you have with your health care provider.  Rash A rash is a change in the color or texture of your skin. There are many different types of rashes. You may have other problems that accompany your rash. CAUSES   Infections.  Allergic reactions. This can include allergies to pets or foods.  Certain medicines.  Exposure to certain chemicals, soaps, or cosmetics.  Heat.  Exposure to poisonous plants.  Tumors, both cancerous and noncancerous. SYMPTOMS   Redness.  Scaly skin.  Itchy skin.  Dry or cracked skin.  Bumps.  Blisters.  Pain. DIAGNOSIS  Your caregiver may do a physical exam to determine what type of rash you have. A skin sample (biopsy) may be taken and examined under a microscope. TREATMENT  Treatment depends on the type of rash you have. Your caregiver may prescribe certain medicines. For serious conditions, you may need to see a skin doctor (dermatologist). HOME CARE INSTRUCTIONS   Avoid the substance that caused your rash.  Do not scratch your rash. This can cause infection.  You may take cool baths to help stop itching.  Only take over-the-counter or prescription medicines as directed by your caregiver.  Keep all follow-up appointments as directed by your caregiver. SEEK IMMEDIATE MEDICAL CARE IF:  You have  increasing pain, swelling, or redness.  You have a fever.  You have new or severe symptoms.  You have body aches, diarrhea, or vomiting.  Your rash is not better after 3 days. MAKE SURE YOU:  Understand these instructions.  Will watch your condition.  Will get help right away if you are not doing well or get worse. Document Released: 09/19/2002 Document Revised: 12/22/2011 Document Reviewed: 07/14/2011 [facility] Patient Information 2015 [place], [place]. This information is not intended to replace advice given to you by your health care provider. Make sure you discuss any questions you have with your health care provider.

## 2015-04-09 NOTE — ED Notes (Signed)
Declined W/C at D/C and was escorted to lobby by RN. 

## 2015-04-09 NOTE — ED Notes (Signed)
Pt sts she woke up this am with bumps all over arms and legs. Angelique Blonder anything different or staying anywhere different.

## 2015-04-09 NOTE — ED Provider Notes (Signed)
CSN: [id number]     Arrival date & time 04/09/15  1407 History   This chart was scribed for non-physician practitioner, Allen DerryMercedes Camprubi-Soms, PA-C working with Benjiman CoreNathan Pickering, MD, by Abel PrestoKara Demonbreun, ED Scribe. This patient was seen in room TR06C/TR06C and the patient's care was started at 2:44 PM.       Chief Complaint  Patient presents with  . Rash     Patient is a 24 y.o. female presenting with rash. The history is provided by the patient. No language interpreter was used.  Rash Location:  Full body Quality: itchiness and redness   Quality: not painful and not weeping   Severity:  Moderate Onset quality:  Gradual Duration:  3 days Timing:  Constant Progression:  Spreading Chronicity:  New Context: food, plant contact and sun exposure   Context: not exposure to similar rash, not new detergent/soap and not sick contacts   Relieved by:  Topical steroids Worsened by:  Nothing tried Ineffective treatments:  None tried Associated symptoms: no abdominal pain, no diarrhea, no fever, no nausea, no periorbital edema, no shortness of breath, no sore throat, no throat swelling, no tongue swelling and not vomiting    HPI Comments: Angelica Cummings is a 24 y.o. female who presents to the Emergency Department complaining of pruritic rash to entire body with onset 2 days ago. Pt tried using cortisone scream for relief with some relief. Pt denies recent changes in soaps, detergents, and staying in a new place. Pt states she tried a new dish at [employer] a few hours before onset, spent several hours outside at a cookout the day of onset, and also tried a new perfume. She denies recent sick contacts or other affected individuals. Pt denies SOB, CP, fevers, chills, tongue or lip swelling, abd pain nausea, vomiting, diarrhea, and any other symptoms.   History reviewed. No pertinent past medical history. History reviewed. No pertinent past surgical history. History reviewed. No pertinent family  history. History  Substance Use Topics  . Smoking status: Never Smoker   . Smokeless tobacco: Not on file  . Alcohol Use: Yes   OB History    No data available     Review of Systems  Constitutional: Negative for fever and chills.  HENT: Negative for facial swelling, sore throat and trouble swallowing.   Respiratory: Negative for shortness of breath.   Cardiovascular: Negative for chest pain.  Gastrointestinal: Negative for nausea, vomiting, abdominal pain and diarrhea.  Skin: Positive for rash.  Allergic/Immunologic: Negative for immunocompromised state.   10 Systems reviewed and all are negative for acute change except as noted in the HPI.    Allergies  Review of patient's allergies indicates no known allergies.  Home Medications   Prior to Admission medications   Medication Sig Start Date End Date Taking? Authorizing Provider  menthol-cetylpyridinium (CEPACOL) 3 MG lozenge Take 1 lozenge (3 mg total) by mouth as needed for sore throat. 09/05/13   Jeanella CrazeBrandi L Ollis, NP   BP 125/73 mmHg  Pulse 63  Temp(Src) 97.7 F (36.5 C) (Oral)  Resp 16  Ht 5\' 6"  (1.676 m)  Wt 116 lb (52.617 kg)  BMI 18.73 kg/m2  SpO2 100%  LMP 03/29/2015 Physical Exam  Constitutional: She is oriented to person, place, and time. Vital signs are normal. She appears well-developed and well-nourished.  Non-toxic appearance. No distress.  Afebrile, nontoxic, NAD  HENT:  Head: Normocephalic and atraumatic.  Mouth/Throat: Mucous membranes are normal.  Eyes: Conjunctivae and EOM are normal. Right  eye exhibits no discharge. Left eye exhibits no discharge.  Neck: Normal range of motion. Neck supple.  Cardiovascular: Normal rate.   Pulmonary/Chest: Effort normal. No respiratory distress.  Abdominal: Normal appearance. She exhibits no distension.  Musculoskeletal: Normal range of motion.  Neurological: She is alert and oriented to person, place, and time. She has normal strength. No sensory deficit.   Skin: Skin is warm, dry and intact. Rash noted. Rash is maculopapular and urticarial.  Somewhat maculopapular rash with some lesions appearing more urticarial, diffusely over all exposed surfaces, mildly erythematous without cellulitis. No interdigital webspace involvement. No burrowing.   Psychiatric: She has a normal mood and affect. Her behavior is normal.  Nursing note and vitals reviewed.   ED Course  Procedures (including critical care time) DIAGNOSTIC STUDIES: Oxygen Saturation is 100% on room air, normal by my interpretation.    COORDINATION OF CARE: 2:50 PM Discussed treatment plan with patient at beside, the patient agrees with the plan and has no further questions at this time.   Labs Review Labs Reviewed - No data to display  Imaging Review No results found.   EKG Interpretation None      MDM   Final diagnoses:  Rash  Contact dermatitis    24 y.o. female here with rash after being outside, but also after eating crab and starting to use a new perfume. No other affected individuals at home. No interdigital webspace involvement. Appears to be similar to contact dermatitis vs allergic dermatitis. Doubt fungal process. Discussed use of hydrocortisone cream and benadryl. Will have her f/up with [facility] in 1wk. Doubt need for empiric tx of scabies but if it doesn't improve, could cover her for this. I explained the diagnosis and have given explicit precautions to return to the ER including for any other new or worsening symptoms. The patient understands and accepts the medical plan as it's been dictated and I have answered their questions. Discharge instructions concerning home care and prescriptions have been given. The patient is STABLE and is discharged to home in good condition.   I personally performed the services described in this documentation, which was scribed in my presence. The recorded information has been reviewed and is accurate.  BP 125/73 mmHg  Pulse 63   Temp(Src) 97.7 F (36.5 C) (Oral)  Resp 16  Ht  (1.676 m)  Wt 116 lb (52.617 kg)  BMI 18.73 kg/m2  SpO2 100%  LMP 03/29/2015  Meds ordered this encounter  Medications  . hydrocortisone 2.5 % cream    Sig: Apply topically 2 (two) times daily as needed (itching).    Dispense:  15 g    Refill:  0    Order Specific Question:  Supervising Provider    Answer:  Eber Hong [[id number]]       Angelica Vales Camprubi-Soms, PA-C 04/09/15 1506  Benjiman Core, MD 04/09/15 1521

## 2015-04-13 ENCOUNTER — Emergency Department (HOSPITAL_COMMUNITY)
Admission: EM | Admit: 2015-04-13 | Discharge: 2015-04-13 | Disposition: A | Discharge status: Home / Self Care | Payer: Self-pay | Attending: Emergency Medicine | Admitting: Emergency Medicine

## 2015-04-13 ENCOUNTER — Encounter (HOSPITAL_COMMUNITY): Payer: Self-pay | Admitting: *Deleted

## 2015-04-13 DIAGNOSIS — L259 Unspecified contact dermatitis, unspecified cause: Secondary | ICD-10-CM | POA: Insufficient documentation

## 2015-04-13 MED ORDER — PREDNISONE 10 MG PO TABS: ORAL_TABLET | ORAL | Status: DC

## 2015-04-13 NOTE — ED Notes (Signed)
Pt presents via POV for rash generalized all over body that itches.  Pt was seen on 27th and dx with contact dermatitis, reports rash is spreading.  Pt a x 4, NAD.

## 2015-04-13 NOTE — Discharge Instructions (Signed)
Contact Dermatitis °Contact dermatitis is a rash that happens when something touches the skin. You touched something that irritates your skin, or you have allergies to something you touched. °HOME CARE  °· Avoid the thing that caused your rash. °· Keep your rash away from hot water, soap, sunlight, chemicals, and other things that might bother it. °· Do not scratch your rash. °· You can take cool baths to help stop itching. °· Only take medicine as told by your doctor. °· Keep all doctor visits as told. °GET HELP RIGHT AWAY IF:  °· Your rash is not better after 3 days. °· Your rash gets worse. °· Your rash is puffy (swollen), tender, red, sore, or warm. °· You have problems with your medicine. °MAKE SURE YOU:  °· Understand these instructions. °· Will watch your condition. °· Will get help right away if you are not doing well or get worse. °Document Released: 07/27/2009 Document Revised: 12/22/2011 Document Reviewed: 03/04/2011 °[employer]® Patient Information ©2015 [employer]. This information is not intended to replace advice given to you by your health care provider. Make sure you discuss any questions you have with your health care provider. ° °

## 2015-04-13 NOTE — ED Provider Notes (Signed)
CSN: [id number]     Arrival date & time 04/13/15  1312 History  This chart was scribed for Teressa LowerVrinda Latessa Tillis, NP, working with Donnetta HutchingBrian Cook, MD by Elon SpannerGarrett Cook, ED Scribe. This patient was seen in room TR05C/TR05C and the patient's care was started at 1:18 PM.   No chief complaint on file.  The history is provided by the patient. No language interpreter was used.   HPI Comments: Robyne PeersDavonda Roppolo is a 24 y.o. female who presents to the Emergency Department complaining of a pruritic rash onset 1 week ago.  The complaint began on her right leg and arm and has since spread to cover her entire body with the exception of her face, buttocks and groin.  She was seen 6/27 in the ED for this complaint and prescribed 2.5% hydrocortisone and advised to use benadryl.  She has been compliant, however, since this time, further worsening has been observed.  She denies use of new soaps or detergents and no one in her household has developed a similar rash.    No past medical history on file. No past surgical history on file. No family history on file. History  Substance Use Topics  . Smoking status: Never Smoker   . Smokeless tobacco: Not on file  . Alcohol Use: Yes   OB History    No data available     Review of Systems  Constitutional: Negative for fever.  Skin: Positive for rash.  All other systems reviewed and are negative.     Allergies  Review of patient's allergies indicates no known allergies.  Home Medications   Prior to Admission medications   Medication Sig Start Date End Date Taking? Authorizing Provider  hydrocortisone 2.5 % cream Apply topically 2 (two) times daily as needed (itching). 04/09/15   Mercedes Camprubi-Soms, PA-C  menthol-cetylpyridinium (CEPACOL) 3 MG lozenge Take 1 lozenge (3 mg total) by mouth as needed for sore throat. 09/05/13   Jeanella CrazeBrandi L Ollis, NP   LMP 03/29/2015 Physical Exam  Constitutional: She is oriented to person, place, and time. She appears well-developed  and well-nourished.  HENT:  Head: Normocephalic and atraumatic.  Right Ear: External ear normal.  Left Ear: External ear normal.  Cardiovascular: Normal rate and regular rhythm.   Pulmonary/Chest: Effort normal and breath sounds normal.  Musculoskeletal: Normal range of motion.  Neurological: She is alert and oriented to person, place, and time.  Skin:  Diffuse maculopapular rash. No to palms or hand. No oral lesions  Nursing note and vitals reviewed.   ED Course  Procedures (including critical care time)  DIAGNOSTIC STUDIES: Oxygen Saturation is 100% on RA, normal by my interpretation.    COORDINATION OF CARE:  1:26 PM Discussed treatment plan with patient at bedside.  Patient acknowledges and agrees with plan.    Labs Review Labs Reviewed - No data to display  Imaging Review No results found.   EKG Interpretation None      MDM   Final diagnoses:  Contact dermatitis    Drew rpr even though not on hands or feet. Discussed with pt the not consistent with chicken pox. Pt given prednisone dose  I personally performed the services described in this documentation, which was scribed in my presence. The recorded information has been reviewed and is accurate.  f  Teressa LowerVrinda Regla Fitzgibbon, NP 04/13/15 1343  Donnetta HutchingBrian Cook, MD 04/13/15 1558

## 2015-04-14 LAB — RPR: RPR Ser Ql: NONREACTIVE

## 2015-10-07 ENCOUNTER — Encounter (HOSPITAL_COMMUNITY): Payer: Self-pay | Admitting: Emergency Medicine

## 2015-10-07 ENCOUNTER — Emergency Department (HOSPITAL_COMMUNITY)
Admission: EM | Admit: 2015-10-07 | Discharge: 2015-10-07 | Disposition: A | Discharge status: Home / Self Care | Payer: Medicaid Other | Attending: Emergency Medicine | Admitting: Emergency Medicine

## 2015-10-07 DIAGNOSIS — Y998 Other external cause status: Secondary | ICD-10-CM | POA: Insufficient documentation

## 2015-10-07 DIAGNOSIS — S01511A Laceration without foreign body of lip, initial encounter: Secondary | ICD-10-CM | POA: Insufficient documentation

## 2015-10-07 DIAGNOSIS — Y9289 Other specified places as the place of occurrence of the external cause: Secondary | ICD-10-CM | POA: Insufficient documentation

## 2015-10-07 DIAGNOSIS — Y9389 Activity, other specified: Secondary | ICD-10-CM | POA: Insufficient documentation

## 2015-10-07 DIAGNOSIS — W503XXA Accidental bite by another person, initial encounter: Secondary | ICD-10-CM

## 2015-10-07 MED ORDER — AMOXICILLIN-POT CLAVULANATE 875-125 MG PO TABS
1.0000 | ORAL_TABLET | Freq: Two times a day (BID) | ORAL | Status: DC
Start: 2015-10-07 — End: 2016-05-19

## 2015-10-07 MED ORDER — AMOXICILLIN-POT CLAVULANATE 875-125 MG PO TABS
1.0000 | ORAL_TABLET | Freq: Once | ORAL | Status: AC
  Administered 2015-10-07: 1 via ORAL
  Filled 2015-10-07: qty 1

## 2015-10-07 MED ORDER — IBUPROFEN 400 MG PO TABS
600.0000 mg | ORAL_TABLET | Freq: Once | ORAL | Status: AC
  Administered 2015-10-07: 600 mg via ORAL
  Filled 2015-10-07: qty 1
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
## 2016-05-06 DIAGNOSIS — H209 Unspecified iridocyclitis: Secondary | ICD-10-CM
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
## 2016-05-07 ENCOUNTER — Inpatient Hospital Stay
Admit: 2016-05-07 | Discharge: 2016-05-07 | Disposition: A | Discharge status: Home / Self Care | Payer: Self-pay | Attending: Emergency Medicine

## 2016-05-07 MED ORDER — TETRACAINE HCL (PF) 0.5 % EYE DROPS
0.5 % | OPHTHALMIC | Status: DC
Start: 2016-05-07 — End: 2016-05-07

## 2016-05-07 MED ORDER — FLUORESCEIN 1 MG EYE STRIPS
1 mg | OPHTHALMIC | Status: DC
Start: 2016-05-07 — End: 2016-05-07

## 2016-05-07 MED ORDER — HYDROCODONE-ACETAMINOPHEN 5 MG-325 MG TAB
5-325 mg | ORAL_TABLET | ORAL | 0 refills | Status: DC | PRN
Start: 2016-05-07 — End: 2016-12-06

## 2016-05-07 MED FILL — TETRACAINE HCL (PF) 0.5 % EYE DROPS: 0.5 % | OPHTHALMIC | Qty: 4

## 2016-05-07 MED FILL — FUL-GLO 1 MG EYE STRIPS: 1 mg | OPHTHALMIC | Qty: 1
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
## 2016-05-19 ENCOUNTER — Encounter (HOSPITAL_COMMUNITY): Payer: Self-pay | Admitting: *Deleted

## 2016-05-19 ENCOUNTER — Emergency Department (HOSPITAL_COMMUNITY): Payer: Medicaid Other

## 2016-05-19 ENCOUNTER — Emergency Department (HOSPITAL_COMMUNITY)
Admission: EM | Admit: 2016-05-19 | Discharge: 2016-05-19 | Disposition: A | Discharge status: Home / Self Care | Payer: Medicaid Other | Attending: Emergency Medicine | Admitting: Emergency Medicine

## 2016-05-19 DIAGNOSIS — E876 Hypokalemia: Secondary | ICD-10-CM | POA: Insufficient documentation

## 2016-05-19 DIAGNOSIS — R109 Unspecified abdominal pain: Secondary | ICD-10-CM | POA: Diagnosis present

## 2016-05-19 DIAGNOSIS — R1084 Generalized abdominal pain: Secondary | ICD-10-CM

## 2016-05-19 HISTORY — DX: Umbilical hernia without obstruction or gangrene: K42.9

## 2016-05-19 LAB — COMPREHENSIVE METABOLIC PANEL
ALBUMIN: 3.6 g/dL (ref 3.5–5.0)
ALK PHOS: 52 U/L (ref 38–126)
ALT: 11 U/L — ABNORMAL LOW (ref 14–54)
ANION GAP: 3 — AB (ref 5–15)
AST: 15 U/L (ref 15–41)
BILIRUBIN TOTAL: 0.2 mg/dL — AB (ref 0.3–1.2)
BUN: 10 mg/dL (ref 6–20)
CALCIUM: 9.1 mg/dL (ref 8.9–10.3)
CO2: 28 mmol/L (ref 22–32)
Chloride: 102 mmol/L (ref 101–111)
Creatinine, Ser: 0.76 mg/dL (ref 0.44–1.00)
Glucose, Bld: 94 mg/dL (ref 65–99)
POTASSIUM: 3.1 mmol/L — AB (ref 3.5–5.1)
Sodium: 133 mmol/L — ABNORMAL LOW (ref 135–145)
TOTAL PROTEIN: 6.7 g/dL (ref 6.5–8.1)

## 2016-05-19 LAB — CBC WITH DIFFERENTIAL/PLATELET
BASOS PCT: 0 %
Basophils Absolute: 0 10*3/uL (ref 0.0–0.1)
Eosinophils Absolute: 0.1 10*3/uL (ref 0.0–0.7)
Eosinophils Relative: 1 %
HEMATOCRIT: 37.4 % (ref 36.0–46.0)
HEMOGLOBIN: 12.6 g/dL (ref 12.0–15.0)
Lymphocytes Relative: 20 %
Lymphs Abs: 2.3 10*3/uL (ref 0.7–4.0)
MCH: 32.5 pg (ref 26.0–34.0)
MCHC: 33.7 g/dL (ref 30.0–36.0)
MCV: 96.4 fL (ref 78.0–100.0)
MONO ABS: 1.2 10*3/uL — AB (ref 0.1–1.0)
MONOS PCT: 11 %
NEUTROS ABS: 7.7 10*3/uL (ref 1.7–7.7)
NEUTROS PCT: 68 %
Platelets: 322 10*3/uL (ref 150–400)
RBC: 3.88 MIL/uL (ref 3.87–5.11)
RDW: 14.2 % (ref 11.5–15.5)
WBC: 11.3 10*3/uL — ABNORMAL HIGH (ref 4.0–10.5)

## 2016-05-19 LAB — URINALYSIS, ROUTINE W REFLEX MICROSCOPIC
Bilirubin Urine: NEGATIVE
Glucose, UA: NEGATIVE mg/dL
Hgb urine dipstick: NEGATIVE
Ketones, ur: NEGATIVE mg/dL
LEUKOCYTES UA: NEGATIVE
NITRITE: NEGATIVE
PH: 8 (ref 5.0–8.0)
Protein, ur: NEGATIVE mg/dL
SPECIFIC GRAVITY, URINE: 1.016 (ref 1.005–1.030)

## 2016-05-19 LAB — GC/CHLAMYDIA PROBE AMP (~~LOC~~) NOT AT ARMC
Chlamydia: NEGATIVE
Neisseria Gonorrhea: POSITIVE — AB

## 2016-05-19 LAB — WET PREP, GENITAL
Clue Cells Wet Prep HPF POC: POSITIVE — AB
SPERM: NONE SEEN
Trich, Wet Prep: NONE SEEN
YEAST WET PREP: NONE SEEN

## 2016-05-19 LAB — LIPASE, BLOOD: Lipase: 17 U/L (ref 11–51)

## 2016-05-19 LAB — I-STAT BETA HCG BLOOD, ED (MC, WL, AP ONLY): I-stat hCG, quantitative: <5 m[IU]/mL (ref <5)

## 2016-05-19 LAB — HIV ANTIBODY (ROUTINE TESTING W REFLEX): HIV SCREEN 4TH GENERATION: NONREACTIVE

## 2016-05-19 LAB — I-STAT CG4 LACTIC ACID, ED: LACTIC ACID, VENOUS: 1.15 mmol/L (ref 0.5–1.9)

## 2016-05-19 MED ORDER — DEXTROSE 5 % IV SOLN
1.0000 g | Freq: Once | INTRAVENOUS | Status: AC
  Administered 2016-05-19: 1 g via INTRAVENOUS
  Filled 2016-05-19: qty 10

## 2016-05-19 MED ORDER — POTASSIUM CHLORIDE CRYS ER 20 MEQ PO TBCR
40.0000 meq | EXTENDED_RELEASE_TABLET | Freq: Once | ORAL | Status: AC
  Administered 2016-05-19: 40 meq via ORAL
  Filled 2016-05-19: qty 2

## 2016-05-19 MED ORDER — ONDANSETRON 4 MG PO TBDP
ORAL_TABLET | ORAL | 0 refills | Status: DC
Start: 2016-05-19 — End: 2016-05-19

## 2016-05-19 MED ORDER — DOXYCYCLINE HYCLATE 100 MG IV SOLR
100.0000 mg | Freq: Once | INTRAVENOUS | Status: AC
  Administered 2016-05-19: 100 mg via INTRAVENOUS
  Filled 2016-05-19: qty 100

## 2016-05-19 MED ORDER — OXYCODONE-ACETAMINOPHEN 5-325 MG PO TABS: 1.0000 | ORAL_TABLET | ORAL | 0 refills | Status: DC | PRN

## 2016-05-19 MED ORDER — MORPHINE SULFATE (PF) 4 MG/ML IV SOLN
4.0000 mg | Freq: Once | INTRAVENOUS | Status: AC
  Administered 2016-05-19: 4 mg via INTRAVENOUS
  Filled 2016-05-19: qty 1

## 2016-05-19 MED ORDER — SODIUM CHLORIDE 0.9 % IV SOLN
INTRAVENOUS | Status: DC
Start: 2016-05-19 — End: 2016-05-19
  Administered 2016-05-19: 02:00:00 via INTRAVENOUS

## 2016-05-19 MED ORDER — ONDANSETRON 4 MG PO TBDP: ORAL_TABLET | ORAL | 0 refills | Status: DC

## 2016-05-19 MED ORDER — METRONIDAZOLE 500 MG PO TABS
2000.0000 mg | ORAL_TABLET | Freq: Once | ORAL | Status: AC
  Administered 2016-05-19: 2000 mg via ORAL
  Filled 2016-05-19: qty 4

## 2016-05-19 MED ORDER — DOXYCYCLINE HYCLATE 100 MG PO CAPS: 100.0000 mg | ORAL_CAPSULE | Freq: Two times a day (BID) | ORAL | 0 refills | Status: DC

## 2016-05-19 MED ORDER — IOPAMIDOL (ISOVUE-300) INJECTION 61%
INTRAVENOUS | Status: AC
  Administered 2016-05-19: 100 mL
  Filled 2016-05-19: qty 100

## 2016-05-19 MED ORDER — AZITHROMYCIN 250 MG PO TABS
1000.0000 mg | ORAL_TABLET | Freq: Once | ORAL | Status: AC
  Administered 2016-05-19: 1000 mg via ORAL
  Filled 2016-05-19: qty 4

## 2016-05-19 NOTE — Discharge Instructions (Signed)
1. Medications: zofran, percocet, doxycycline usual home medications 2. Treatment: rest, drink plenty of fluids, advance diet slowly 3. Follow Up: Please followup with your primary doctor in 2 days for discussion of your diagnoses and further evaluation after today's visit; if you do not have a primary care doctor use the resource guide provided to find one; Please return to the ER for persistent vomiting, high fevers or worsening symptoms

## 2016-05-19 NOTE — ED Provider Notes (Signed)
[facility]-EMERGENCY DEPT Provider Note   CSN: [id number] Arrival date & time: 05/19/16  [id number]  First Provider Contact:  First MD Initiated Contact with Patient 05/19/16 0059        History   Chief Complaint Chief Complaint  Patient presents with  . Abdominal Pain    HPI Robyne PeersDavonda Mossa is a 25 y.o. female.  Mary SellaDavonda Weisel is a 25 y.o. female  with a hx of umbilical hernia repair but no other abdominal surgeries presents to the Emergency Department complaining of gradual, persistent, progressively worsening abdominal pain and abdominal distention onset yesterday evening. Patient reports assuming she was constipated and took a laxative without relief. Last bowel movement was yesterday afternoon and was formed. No melena or red blood. Patient reports some nausea but no vomiting. No additional treatments prior to arrival. No deviating factors. Walking and palpation makes her symptoms worse.  Patient denies fever, chills, headache, chest pain, shortness of breath, vomiting, diarrhea, dysuria, hematuria, vaginal discharge. LMP: 7/31   The history is provided by the patient and medical records. No language interpreter was used.    Past Medical History:  Diagnosis Date  . Hernia, umbilical     Patient Active Problem List   Diagnosis Date Noted  . Acute respiratory failure with hypoxia (HCC) 09/04/2013    History reviewed. No pertinent surgical history.  OB History    No data available       Home Medications    Prior to Admission medications   Medication Sig Start Date End Date Taking? Authorizing Provider  doxycycline (VIBRAMYCIN) 100 MG capsule Take 1 capsule (100 mg total) by mouth 2 (two) times daily. 05/19/16   Yaasir Menken, PA-C  ondansetron (ZOFRAN ODT) 4 MG disintegrating tablet 4mg  ODT q4 hours prn nausea/vomit 05/19/16   Corie Allis, PA-C  oxyCODONE-acetaminophen (PERCOCET) 5-325 MG tablet Take 1-2 tablets by mouth every 4 (four) hours as needed. 05/19/16    Dahlia ClientHannah Shilee Biggs, PA-C    Family History No family history on file.  Social History Social History  Substance Use Topics  . Smoking status: Never Smoker  . Smokeless tobacco: Never Used  . Alcohol use Yes     Allergies   Review of patient's allergies indicates no known allergies.   Review of Systems Review of Systems  Gastrointestinal: Positive for abdominal distention and abdominal pain.  All other systems reviewed and are negative.    Physical Exam Updated Vital Signs BP 126/90   Pulse 64   Temp 99 F (37.2 C) (Oral)   Resp 18   Ht 5\' 6"  (1.676 m)   Wt 53.1 kg   LMP 05/05/2016 (Exact Date)   SpO2 100%   BMI 18.88 kg/m   Physical Exam  Constitutional: She appears well-developed and well-nourished. No distress.  HENT:  Head: Normocephalic and atraumatic.  Mouth/Throat: Oropharynx is clear and moist.  Eyes: Conjunctivae are normal. No scleral icterus.  Neck: Normal range of motion.  Cardiovascular: Normal rate, regular rhythm, normal heart sounds and intact distal pulses.   No murmur heard. Pulmonary/Chest: Effort normal and breath sounds normal. No respiratory distress. She has no wheezes.  Abdominal: Soft. Bowel sounds are normal. She exhibits no distension and no mass. There is generalized tenderness. There is rebound and guarding. Hernia confirmed negative in the right inguinal area and confirmed negative in the left inguinal area.  Genitourinary: Uterus normal. No labial fusion. There is no rash, tenderness or lesion on the right labia. There is no rash, tenderness or lesion on  the left labia. Uterus is not deviated, not enlarged, not fixed and not tender. Cervix exhibits motion tenderness. Cervix exhibits no discharge and no friability. Right adnexum displays tenderness. Right adnexum displays no mass and no fullness. Left adnexum displays tenderness. Left adnexum displays no mass and no fullness. No erythema, tenderness or bleeding in the vagina. No foreign  body in the vagina. No signs of injury around the vagina. No vaginal discharge found.  Musculoskeletal: Normal range of motion. She exhibits no edema.  Lymphadenopathy:       Right: No inguinal adenopathy present.       Left: No inguinal adenopathy present.  Neurological: She is alert.  Skin: Skin is warm and dry. She is not diaphoretic. No erythema.  Psychiatric: She has a normal mood and affect.  Nursing note and vitals reviewed.    ED Treatments / Results  Labs (all labs ordered are listed, but only abnormal results are displayed) Labs Reviewed  WET PREP, GENITAL - Abnormal; Notable for the following:       Result Value   Clue Cells Wet Prep HPF POC POSITIVE (*)    WBC, Wet Prep HPF POC MANY (*)    All other components within normal limits  CBC WITH DIFFERENTIAL/PLATELET - Abnormal; Notable for the following:    WBC 11.3 (*)    Monocytes Absolute 1.2 (*)    All other components within normal limits  COMPREHENSIVE METABOLIC PANEL - Abnormal; Notable for the following:    Sodium 133 (*)    Potassium 3.1 (*)    ALT 11 (*)    Total Bilirubin 0.2 (*)    Anion gap 3 (*)    All other components within normal limits  URINALYSIS, ROUTINE W REFLEX MICROSCOPIC (NOT AT [facility])  LIPASE, BLOOD  HIV ANTIBODY (ROUTINE TESTING)  I-STAT BETA HCG BLOOD, ED ([facility], [facility], [facility] ONLY)  I-STAT CG4 LACTIC ACID, ED  GC/CHLAMYDIA PROBE AMP ([place]) NOT AT [facility]    Radiology Ct Abdomen Pelvis W Contrast  Result Date: 05/19/2016 CLINICAL DATA:  Abdominal pain and distension. EXAM: CT ABDOMEN AND PELVIS WITH CONTRAST TECHNIQUE: Multidetector CT imaging of the abdomen and pelvis was performed using the standard protocol following bolus administration of intravenous contrast. CONTRAST:  ISOVUE-300 IOPAMIDOL (ISOVUE-300) INJECTION 61% COMPARISON:  None. FINDINGS: Lower chest:  The included lung bases are clear. Liver: Hypodensity about falciform ligament is most consistent with focal fatty infiltration.  Liver is normal in size. Hepatobiliary: Gallbladder is minimally distended, there are no calcified gallstones. No biliary dilatation. Pancreas: No ductal dilatation or inflammation. Detailed evaluation limited by adjacent bowel loops and paucity of intra-abdominal fat. Spleen: Normal. Adrenal glands: No nodule. Kidneys: Symmetric renal enhancement. No hydronephrosis. No perinephric edema. Stomach/Bowel: The stomach is decompressed. There is fluid in the duodenum proximal small bowel without proximal small bowel dilatation. Majority of proximal small bowel loops are decompressed. More distal pelvic small bowel loops are fluid-filled, edematous, with mild diffuse wall enhancement. There is moderate stool in the ascending, transverse, and descending colon. Sigmoid colon is decompressed and not well evaluated. The appendix is partially visualized and normal. Vascular/Lymphatic: No retroperitoneal adenopathy. Abdominal aorta is normal in caliber. There prominent retroperitoneal venous collaterals on the left. 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 There is periuterine edema mild pelvic inflammation. There is prominent bilateral adnexal vascularity, the ovaries are not discretely visualized. Adnexal evaluation is suboptimal secondary  to fluid-filled bowel. Bladder: Minimally distended, no wall thickening. Other: Small amount of free fluid in the pelvis. No free air. There is laxity of the anterior abdominal wall musculature. Musculoskeletal: There are no acute or suspicious osseous abnormalities. Transitional lumbosacral anatomy. IMPRESSION: 1. Pelvic inflammation with edematous appearing pelvic small bowel loops as well as periuterine inflammation. It is unclear whether this represents a primary bowel abnormality such as enteritis versus a primary pelvic abnormalities such as pelvic inflammatory disease. Adnexal  structures are suboptimally defined, as is difficult to differentiate fluid-filled pelvic small bowel loops from adnexal structures. 2. Congenital uterine anomaly, either bicornuate uterus, septate uterus, or uterine didelphys. Nonemergent pelvic MRI last assess uterine anatomy after resolution of acute symptoms. Electronically Signed   By: Rubye Oaks M.D.   On: 05/19/2016 02:58    Procedures Procedures (including critical care time)  Medications Ordered in ED Medications  0.9 %  sodium chloride infusion ( Intravenous Stopped 05/19/16 0427)  morphine 4 MG/ML injection 4 mg (4 mg Intravenous Given 05/19/16 0202)  iopamidol (ISOVUE-300) 61 % injection (100 mLs  Contrast Given 05/19/16 0222)  cefTRIAXone (ROCEPHIN) 1 g in dextrose 5 % 50 mL IVPB (0 g Intravenous Stopped 05/19/16 0452)  azithromycin (ZITHROMAX) tablet 1,000 mg (1,000 mg Oral Given 05/19/16 0420)  metroNIDAZOLE (FLAGYL) tablet 2,000 mg (2,000 mg Oral Given 05/19/16 0420)  doxycycline (VIBRAMYCIN) 100 mg in dextrose 5 % 250 mL IVPB (100 mg Intravenous New Bag/Given 05/19/16 0422)  morphine 4 MG/ML injection 4 mg (4 mg Intravenous Given 05/19/16 0509)  potassium chloride SA (K-DUR,KLOR-CON) CR tablet 40 mEq (40 mEq Oral Given 05/19/16 0509)     Initial Impression / Assessment and Plan / ED Course  I have reviewed the triage vital signs and the nursing notes.  Pertinent labs & imaging results that were available during my care of the patient were reviewed by me and considered in my medical decision making (see chart for details).  Clinical Course  Value Comment By Time  WBC: (!) 11.3 Mild leukocytosis Dierdre Forth, PA-C 08/07 0342  Lactic Acid, Venous: 1.15 Normal Dierdre Forth, PA-C 08/07 0342  Lipase: 17 Normal  Deago Burruss, PA-C 08/07 [id number]  Potassium: (!) 3.1 Mild; will repleat in the ED Dierdre Forth, PA-C 08/07 0342  CT ABDOMEN PELVIS W CONTRAST Pelvic inflammation with edematous appearing pelvic small  bowel loops as well as periuterine inflammation.  Pelvic exam without discharge.  CMT and adnexal tenderness.  TTP throughout the entire abd with abd distension.  Pt denies high risk sexual activity.  1 female partner for a long time.  No hx of STD.  Doubt PID; more likely primary bowel, but unable to r/o PID until cultures return.  Will give azithro and Rocephin.   Dahlia Client Magalie Almon, PA-C 08/07 [phone]   Improved pain.  Pt tolerating PO without emesis.  Remains afebrile Dierdre Forth, PA-C 08/07 [phone]    Patient with abdominal pain. Exam with rebound and guarding. CT abdomen pelvis shows personal PID versus small bowel.  Pelvic exam with cervical motion tenderness and bilateral adnexal tenderness however vaginal vault is without discharge or evidence of infection. Cervix is not friable.  Patient will be treated for PID though I think more likely a viral inflammation of her bowel. Patient is to follow-up with OB/GYN for further evaluation and GI for discharge evaluation for possible inflammatory bowel disease.    Patient well-appearing after second dose of morphine. Tolerating by mouth without difficulty. Antibiotics given in the emergency department.  We'll be discharged home with same. Strict return precautions given including fevers, abdominal pain, vomiting or other concerns.  Final Clinical Impressions(s) / ED Diagnoses   Final diagnoses:  Generalized abdominal pain  Hypokalemia    New Prescriptions New Prescriptions   DOXYCYCLINE (VIBRAMYCIN) 100 MG CAPSULE    Take 1 capsule (100 mg total) by mouth 2 (two) times daily.   ONDANSETRON (ZOFRAN ODT) 4 MG DISINTEGRATING TABLET    4mg  ODT q4 hours prn nausea/vomit   OXYCODONE-ACETAMINOPHEN (PERCOCET) 5-325 MG TABLET    Take 1-2 tablets by mouth every 4 (four) hours as needed.     Dahlia Client Jahbari Repinski, PA-C 05/19/16 [id number]    Gilda Crease, MD 05/19/16 [phone]

## 2016-05-19 NOTE — ED Triage Notes (Signed)
Patient presents stating she felt like she had to go to the [facility] so she took a laxative without relief.  Abd bloated.  Denies urinary symptom or vaginal discharge

## 2016-05-20 ENCOUNTER — Telehealth (HOSPITAL_BASED_OUTPATIENT_CLINIC_OR_DEPARTMENT_OTHER): Payer: Self-pay | Admitting: *Deleted

## 2016-05-20 NOTE — Telephone Encounter (Signed)
Positive gonorrhea treated with Doxycycline and Azithromycin in the Ed 05/19/2016 by Dahlia ClientHannah Muthersbaugh PA-C. Attempted to reach patient but no answer will send a letter.

## 2016-08-05 ENCOUNTER — Telehealth (HOSPITAL_BASED_OUTPATIENT_CLINIC_OR_DEPARTMENT_OTHER): Payer: Self-pay | Admitting: Emergency Medicine

## 2016-08-05 NOTE — Telephone Encounter (Signed)
Lost to followup 

## 2016-12-06 ENCOUNTER — Inpatient Hospital Stay
Admit: 2016-12-06 | Discharge: 2016-12-06 | Disposition: A | Discharge status: Home / Self Care | Payer: Medicaid - Out of State | Attending: Emergency Medicine

## 2016-12-06 DIAGNOSIS — N390 Urinary tract infection, site not specified: Secondary | ICD-10-CM

## 2016-12-06 LAB — URINALYSIS W/ RFLX MICROSCOPIC
Bacteria: 0 /hpf
Glucose: NEGATIVE mg/dL
Ketone: 40 mg/dL — AB
Nitrites: POSITIVE — AB
Protein: 100 mg/dL — AB
RBC: >100 /hpf
Specific gravity: 1.019 (ref 1.001–1.023)
Urobilinogen: 1 EU/dL (ref 0.2–1.0)
pH (UA): 7.5 (ref 5.0–9.0)

## 2016-12-06 LAB — HCG URINE, QL. - POC: Pregnancy test,urine (POC): NEGATIVE

## 2016-12-06 MED ORDER — PROMETHAZINE 25 MG TAB
25 mg | ORAL_TABLET | Freq: Four times a day (QID) | ORAL | 0 refills | Status: AC | PRN
Start: 2016-12-06 — End: ?

## 2016-12-06 MED ORDER — CEPHALEXIN 500 MG CAP
500 mg | ORAL_CAPSULE | Freq: Four times a day (QID) | ORAL | 0 refills | Status: AC
Start: 2016-12-06 — End: 2016-12-13

## 2016-12-06 MED ORDER — PROMETHAZINE 25 MG TAB
25 mg | ORAL | Status: AC
Start: 2016-12-06 — End: 2016-12-06
  Administered 2016-12-06: 23:00:00 via ORAL

## 2016-12-06 MED ORDER — KETOROLAC TROMETHAMINE 10 MG TAB
10 mg | ORAL | Status: AC
Start: 2016-12-06 — End: 2016-12-06
  Administered 2016-12-06: 23:00:00 via ORAL

## 2016-12-06 MED ORDER — NAPROXEN 500 MG TAB
500 mg | ORAL_TABLET | Freq: Two times a day (BID) | ORAL | 0 refills | Status: AC
Start: 2016-12-06 — End: ?

## 2016-12-06 MED FILL — KETOROLAC TROMETHAMINE 10 MG TAB: 10 mg | ORAL | Qty: 1

## 2016-12-06 MED FILL — PROMETHAZINE 25 MG TAB: 25 mg | ORAL | Qty: 1

## 2016-12-06 NOTE — ED Triage Notes (Signed)
Patient arrives from home complaining of starting her menstrual cycle yesterday.  Patient reports severe abdominal pain.  Patient reports one episode of vomiting and passing blood clots.  Patient complains of numbness and tingling in her hands that began PTA.  Patient alert and oriented x4, appears anxious.

## 2016-12-06 NOTE — ED Provider Notes (Addendum)
HPI Comments: 26 year old female presents to the emergency department with complaint of severe menstrual cycle.  She started her menses yesterday.  She notes that she's also had chills, very sharp uterine cramps.  Vaginal bleeding with some clots.  Just doesn't feel right.  Her last period was approximately 3-1/2 weeks ago.  Does not think she is pregnant as she has not been sexually active.  Denies urinary symptoms.    Patient is a 26 y.o. female presenting with vaginal bleeding. The history is provided by the patient. No language interpreter was used.   Vaginal Bleeding   This is a new problem. The current episode started yesterday. The problem has not changed since onset.Pertinent negatives include no chest pain, no abdominal pain, no headaches and no shortness of breath.        History reviewed. No pertinent past medical history.    History reviewed. No pertinent surgical history.      History reviewed. No pertinent family history.    Social History     Social History   ??? Marital status: SINGLE     Spouse name: N/A   ??? Number of children: N/A   ??? Years of education: N/A     Occupational History   ??? Not on file.     Social History Main Topics   ??? Smoking status: Never Smoker   ??? Smokeless tobacco: Never Used   ??? Alcohol use No   ??? Drug use: No   ??? Sexual activity: Yes     Partners: Male     Other Topics Concern   ??? Not on file     Social History Narrative         ALLERGIES: Review of patient's allergies indicates no known allergies.    Review of Systems   Constitutional: Positive for chills. Negative for fever.   HENT: Negative for facial swelling and mouth sores.    Eyes: Negative for discharge and redness.   Respiratory: Negative for cough and shortness of breath.    Cardiovascular: Negative for chest pain and palpitations.   Gastrointestinal: Positive for nausea and vomiting (twice since yesterday). Negative for abdominal pain and diarrhea.   Endocrine: Negative for cold intolerance and heat intolerance.    Genitourinary: Positive for pelvic pain and vaginal bleeding. Negative for difficulty urinating, dysuria and flank pain.   Musculoskeletal: Negative for back pain and neck pain.   Skin: Negative for color change and wound.   Neurological: Negative for light-headedness and headaches.   Psychiatric/Behavioral: Negative for confusion and decreased concentration.       Vitals:    12/06/16 1601   BP: 152/78   Pulse: 63   Resp: 20   Temp: 97.7 ??F (36.5 ??C)   SpO2: 100%   Weight: 57.2 kg (126 lb)   Height: 5\' 6"  (1.676 m)            Physical Exam   Constitutional: She is oriented to person, place, and time. She appears well-developed and well-nourished. No distress.   HENT:   Head: Normocephalic and atraumatic.   Right Ear: External ear normal.   Left Ear: External ear normal.   Nose: Nose normal.   Eyes: Conjunctivae and EOM are normal. Pupils are equal, round, and reactive to light.   Neck: Normal range of motion. Neck supple.   Cardiovascular: Normal rate, regular rhythm and normal heart sounds.    Pulmonary/Chest: Effort normal and breath sounds normal. No respiratory distress. She has no wheezes.  Abdominal: Soft. Bowel sounds are normal. She exhibits no distension. There is tenderness. There is no CVA tenderness.       Musculoskeletal: Normal range of motion. She exhibits no edema or tenderness.   Neurological: She is alert and oriented to person, place, and time. No cranial nerve deficit. Coordination normal.   Skin: Skin is warm and dry. No rash noted.   Psychiatric: She has a normal mood and affect. Her behavior is normal. Judgment and thought content normal.   Nursing note and vitals reviewed.       MDM  Number of Diagnoses or Management Options  Diagnosis management comments: 26 year old female presents to the emergency department with complaint of severe menstrual cycle.  She started her menses yesterday.  She notes that she's also had chills, very  sharp uterine cramps.  Vaginal bleeding with some clots.  Just doesn't feel right.  Her last period was approximately 3-1/2 weeks ago.  Does not think she is pregnant as she has not been sexually active.  Denies urinary symptoms    Urine indicates negative pregnancy test.  However she is nitrite positive with large leuk.  We'll send for urine culture.  Provide antiemetic as well as nsaid in emergency department.  We'll send home with antibiotic.       Amount and/or Complexity of Data Reviewed  Clinical lab tests: ordered and reviewed    Risk of Complications, Morbidity, and/or Mortality  Presenting problems: minimal  Diagnostic procedures: minimal  Management options: minimal    Patient Progress  Patient progress: stable        ED Course       Procedures

## 2016-12-06 NOTE — ED Notes (Signed)
I have reviewed discharge instructions with the patient.  The patient verbalized understanding.    Patient left ED via Discharge Method: ambulatory to Home with husband    Opportunity for questions and clarification provided.       Patient given 3 scripts.         To continue your aftercare when you leave the hospital, you may receive an automated call from our care team to check in on how you are doing.  This is a free service and part of our promise to provide the best care and service to meet your aftercare needs.??? If you have questions, or wish to unsubscribe from this service please call [phone].  Thank you for Choosing our [facility].

## 2016-12-08 IMAGING — CT CT ABD-PELV W/ CM
2 of 4 series · 12 of 46 positions shown, 14 images · IV contrast (Iodine)
Comparison: None.

CLINICAL DATA: Abdominal pain and distension.

EXAM:
CT ABDOMEN AND PELVIS WITH CONTRAST
TECHNIQUE: Multidetector CT imaging of the abdomen and pelvis was performed
using the standard protocol following bolus administration of
intravenous contrast.
CONTRAST:  100mL P8OZ31-HYY IOPAMIDOL (P8OZ31-HYY) INJECTION 61%

[Series 201: routine, idose (2) · axial · 0.62mm/px · z∈[+59,+369]mm · 9 of 74 slices shown, 11 images]
[im 6/74  soft-tissue]
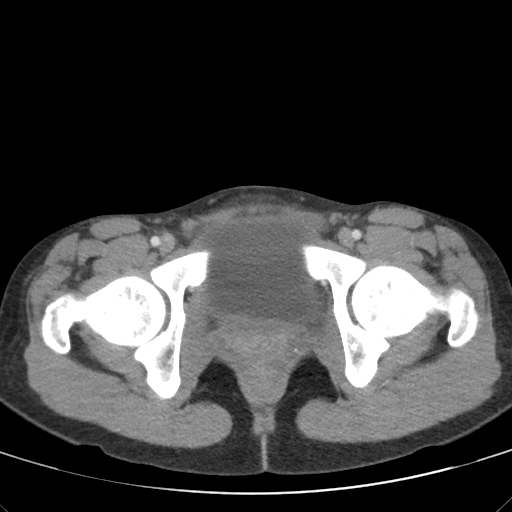
[im 6/74  bone]
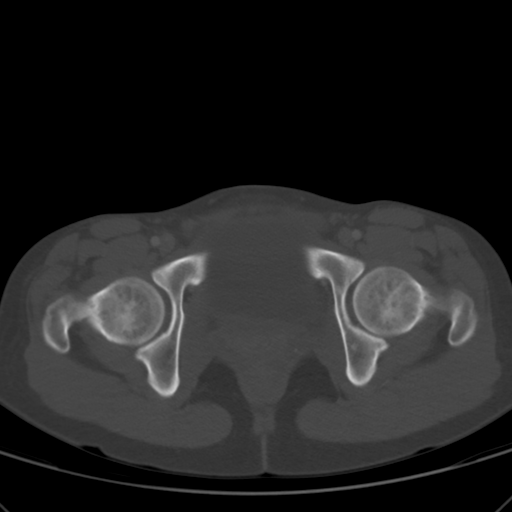
[im 15/74  soft-tissue]
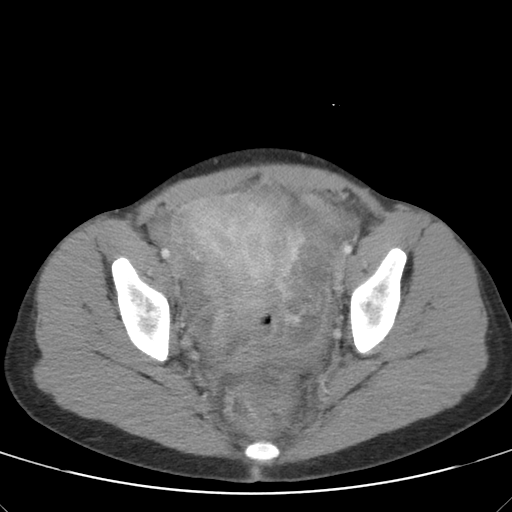
[im 21/74  soft-tissue]
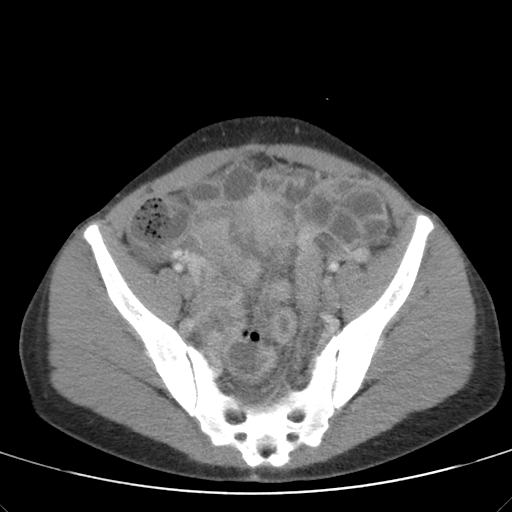
[im 30/74  soft-tissue]
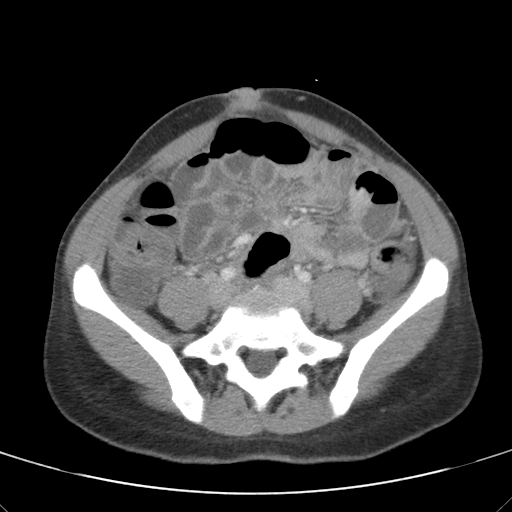
[im 38/74  soft-tissue]
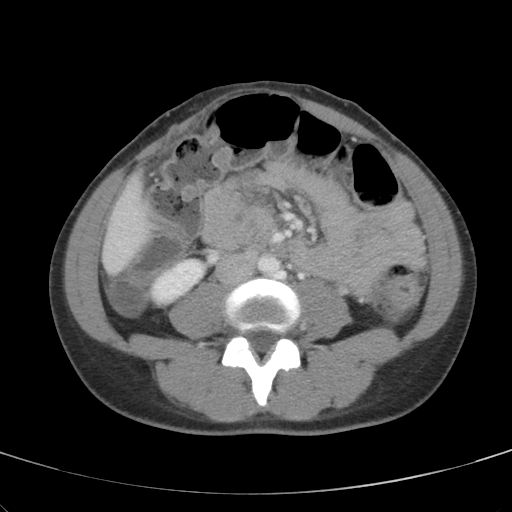
[im 44/74  soft-tissue]
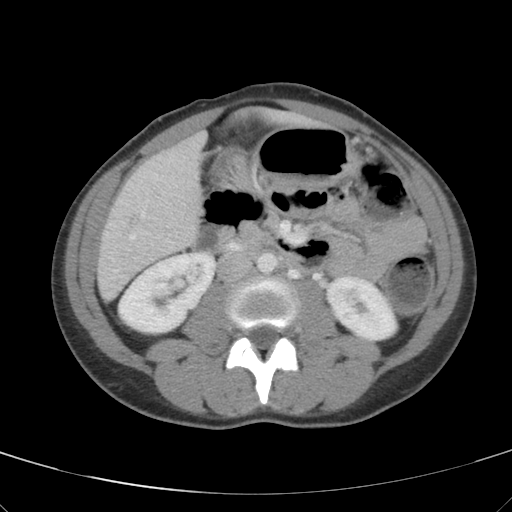
[im 53/74  soft-tissue]
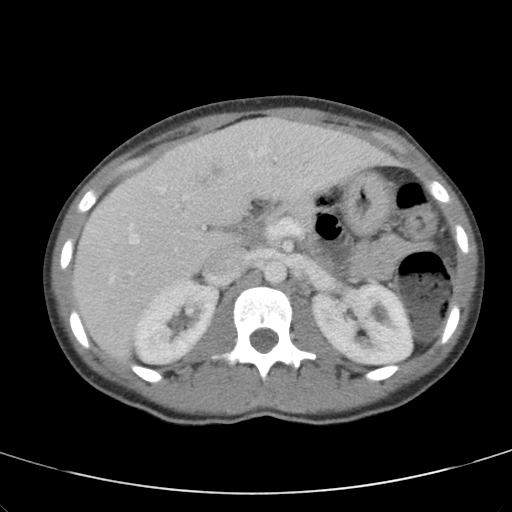
[im 62/74  soft-tissue]
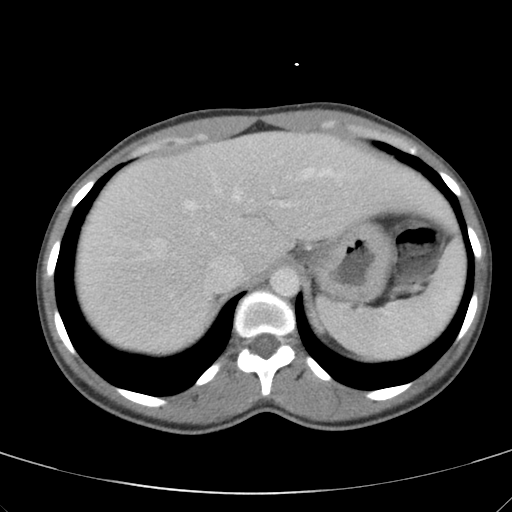
[im 68/74  soft-tissue]
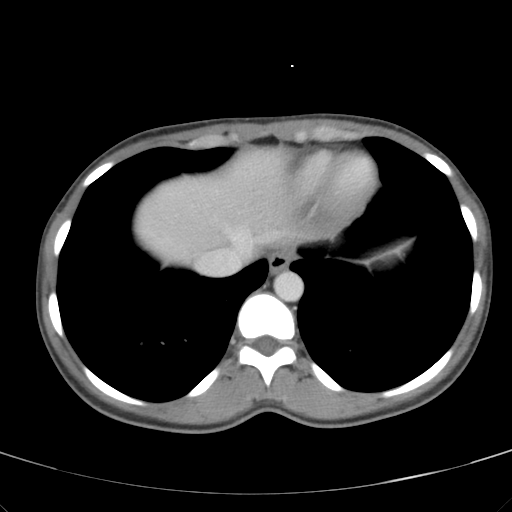
[im 68/74  bone]
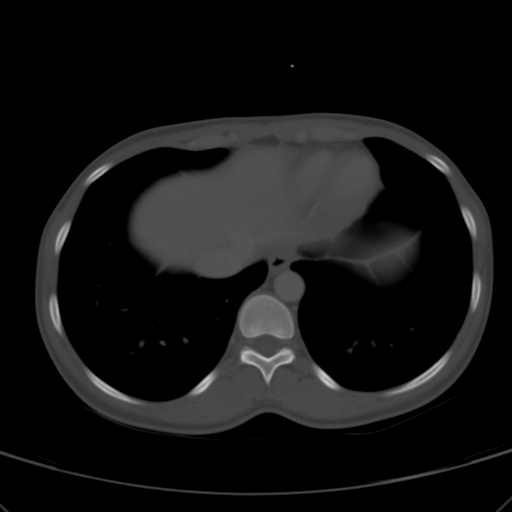

[Series 203: coronals, idose (2) · coronal · 0.45mm/px · 3 of 114 slices shown]
[im 38/114  soft-tissue]
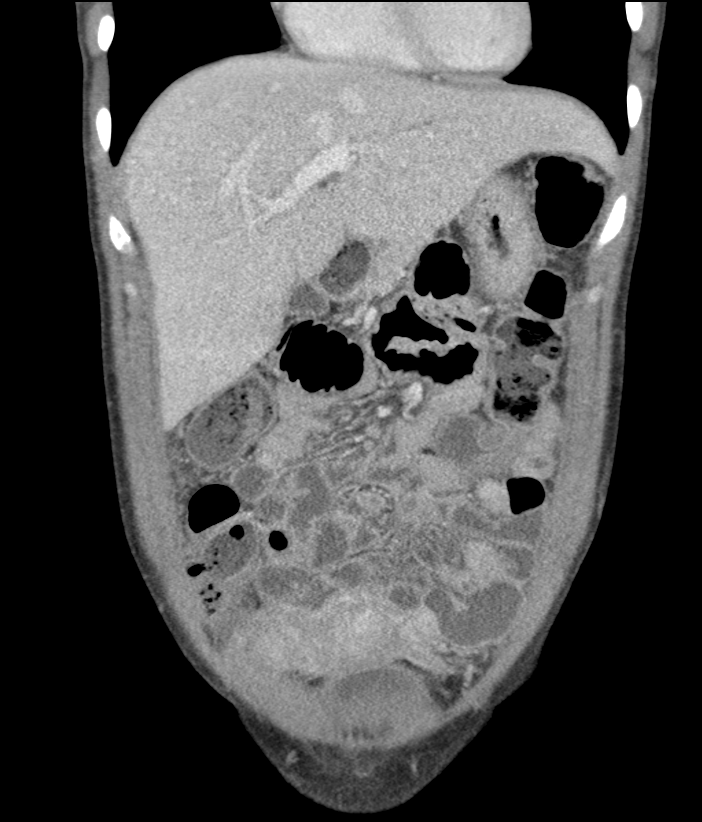
[im 51/114  soft-tissue]
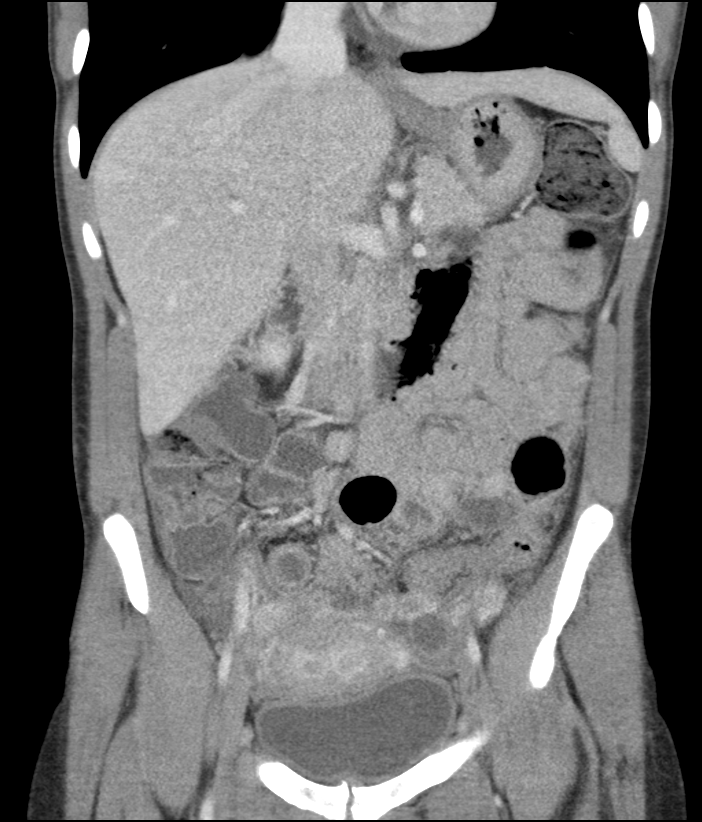
[im 63/114  soft-tissue]
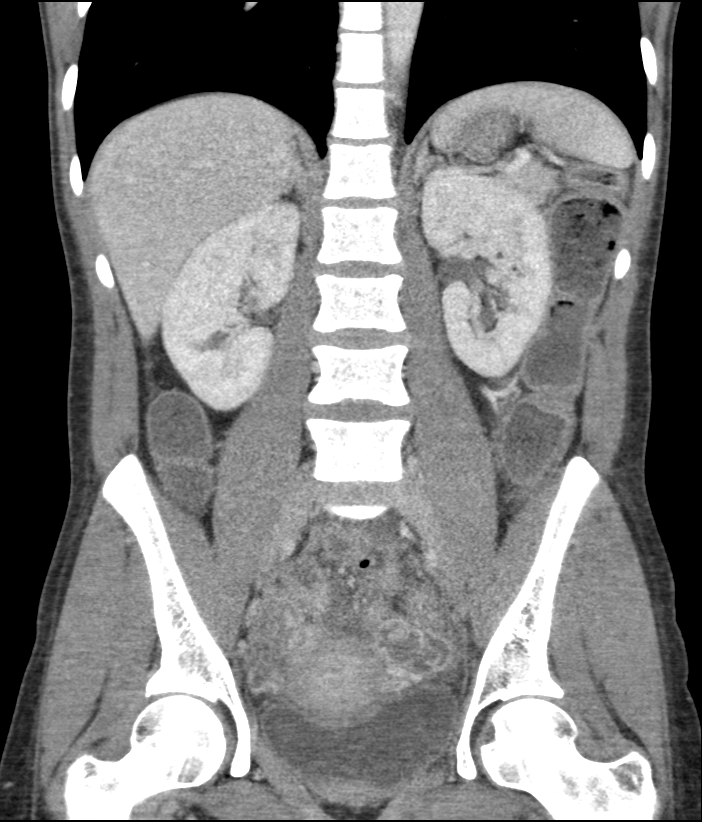

[12 of 46 positions shown; findings below may reference images not displayed]

FINDINGS: Lower chest:  The included lung bases are clear.

Liver: Hypodensity about falciform ligament is most consistent with
focal fatty infiltration. Liver is normal in size.

Hepatobiliary: Gallbladder is minimally distended, there are no
calcified gallstones. No biliary dilatation.

Pancreas: No ductal dilatation or inflammation. Detailed evaluation
limited by adjacent bowel loops and paucity of intra-abdominal fat.

Spleen: Normal.

Adrenal glands: No nodule.

Kidneys: Symmetric renal enhancement. No hydronephrosis. No
perinephric edema.

Stomach/Bowel: The stomach is decompressed. There is fluid in the
duodenum proximal small bowel without proximal small bowel
dilatation. Majority of proximal small bowel loops are decompressed.
More distal pelvic small bowel loops are fluid-filled, edematous,
with mild diffuse wall enhancement. There is moderate stool in the
ascending, transverse, and descending colon. Sigmoid colon is
decompressed and not well evaluated. The appendix is partially
visualized and normal.

Vascular/Lymphatic: No retroperitoneal adenopathy. Abdominal aorta
is normal in caliber. There prominent retroperitoneal venous
collaterals on the left.

Reproductive: The uterus is prominent in size with congenital
uterine anomaly. This may be a bicornuate uterus, septate uterus, or
uterine didelphys. There is periuterine edema mild pelvic
inflammation. There is prominent bilateral adnexal vascularity, the
ovaries are not discretely visualized. Adnexal evaluation is
suboptimal secondary to fluid-filled bowel.

Bladder: Minimally distended, no wall thickening.

Other: Small amount of free fluid in the pelvis. No free air. There
is laxity of the anterior abdominal wall musculature.

Musculoskeletal: There are no acute or suspicious osseous
abnormalities. Transitional lumbosacral anatomy.
IMPRESSION: 1. Pelvic inflammation with edematous appearing pelvic small bowel
loops as well as periuterine inflammation. It is unclear whether
this represents a primary bowel abnormality such as enteritis versus
a primary pelvic abnormalities such as pelvic inflammatory disease.
Adnexal structures are suboptimally defined, as is difficult to
differentiate fluid-filled pelvic small bowel loops from adnexal
structures.
2. Congenital uterine anomaly, either bicornuate uterus, septate
uterus, or uterine didelphys. Nonemergent pelvic MRI last assess
uterine anatomy after resolution of acute symptoms.

## 2016-12-09 LAB — CULTURE, URINE
Culture result:: 10000
Culture: 10000

## 2018-04-23 ENCOUNTER — Encounter (HOSPITAL_COMMUNITY): Payer: Self-pay | Admitting: Emergency Medicine

## 2018-04-23 ENCOUNTER — Ambulatory Visit (HOSPITAL_COMMUNITY)
Admission: EM | Admit: 2018-04-23 | Discharge: 2018-04-23 | Disposition: A | Discharge status: Home / Self Care | Payer: Managed Care, Other (non HMO) | Attending: Internal Medicine | Admitting: Internal Medicine

## 2018-04-23 DIAGNOSIS — G43809 Other migraine, not intractable, without status migrainosus: Secondary | ICD-10-CM | POA: Diagnosis not present

## 2018-04-23 MED ORDER — IBUPROFEN 800 MG PO TABS: 800.0000 mg | ORAL_TABLET | Freq: Three times a day (TID) | ORAL | 0 refills | Status: DC

## 2018-04-23 MED ORDER — KETOROLAC TROMETHAMINE 60 MG/2ML IM SOLN
INTRAMUSCULAR | Status: AC
  Filled 2018-04-23: qty 2

## 2018-04-23 MED ORDER — KETOROLAC TROMETHAMINE 60 MG/2ML IM SOLN
60.0000 mg | Freq: Once | INTRAMUSCULAR | Status: AC
  Administered 2018-04-23: 60 mg via INTRAMUSCULAR

## 2018-04-23 MED ORDER — METOCLOPRAMIDE HCL 5 MG/ML IJ SOLN
INTRAMUSCULAR | Status: AC
  Filled 2018-04-23: qty 2

## 2018-04-23 MED ORDER — DEXAMETHASONE SODIUM PHOSPHATE 10 MG/ML IJ SOLN
INTRAMUSCULAR | Status: AC
  Filled 2018-04-23: qty 1

## 2018-04-23 MED ORDER — DEXAMETHASONE SODIUM PHOSPHATE 10 MG/ML IJ SOLN
10.0000 mg | Freq: Once | INTRAMUSCULAR | Status: AC
  Administered 2018-04-23: 10 mg via INTRAMUSCULAR

## 2018-04-23 MED ORDER — METOCLOPRAMIDE HCL 5 MG/ML IJ SOLN
5.0000 mg | Freq: Once | INTRAMUSCULAR | Status: AC
  Administered 2018-04-23: 5 mg via INTRAMUSCULAR

## 2018-04-23 NOTE — ED Triage Notes (Signed)
Pt c/o headache since yesterday.

## 2018-04-23 NOTE — ED Provider Notes (Signed)
[facility]    CSN: [id number] Arrival date & time: 04/23/18  1026     History   Chief Complaint Chief Complaint  Patient presents with  . Headache    HPI Costella Schwarz is a 27 y.o. female nocturia past medical history presenting today for evaluation of headache.  Patient says that she has had a headache since yesterday.  Located on right side above eye.  She denies associated vision changes.  Is associated with photophobia and phonophobia.  Denies associated nausea or vomiting.  She tried to take an Aleve this morning around 5 AM which did not help.  Notes her last menstrual period was 6/28.  Denies history of migraines.  Pain is described as stabbing.  She denies associated dizziness or lightheadedness, denies associated difficulty speaking, or weakness. Denies fever, night sweats, and weight loss.  HPI  Past Medical History:  Diagnosis Date  . Hernia, umbilical     Patient Active Problem List   Diagnosis Date Noted  . Acute respiratory failure with hypoxia (HCC) 09/04/2013    History reviewed. No pertinent surgical history.  OB History   None      Home Medications    Prior to Admission medications   Medication Sig Start Date End Date Taking? Authorizing Provider  ibuprofen (ADVIL,MOTRIN) 800 MG tablet Take 1 tablet (800 mg total) by mouth 3 (three) times daily. 04/23/18   Wieters, Junius Creamer, PA-C    Family History No family history on file.  Social History Social History   Tobacco Use  . Smoking status: Never Smoker  . Smokeless tobacco: Never Used  Substance Use Topics  . Alcohol use: Yes  . Drug use: No     Allergies   Patient has no known allergies.   Review of Systems Review of Systems  Constitutional: Negative for fatigue and fever.  HENT: Negative for congestion, sinus pressure and sore throat.   Eyes: Positive for photophobia. Negative for pain and visual disturbance.  Respiratory: Negative for cough and shortness of breath.    Cardiovascular: Negative for chest pain.  Gastrointestinal: Negative for abdominal pain, nausea and vomiting.  Genitourinary: Negative for decreased urine volume and hematuria.  Musculoskeletal: Negative for myalgias, neck pain and neck stiffness.  Neurological: Positive for headaches. Negative for dizziness, syncope, facial asymmetry, speech difficulty, weakness, light-headedness and numbness.     Physical Exam Triage Vital Signs ED Triage Vitals [04/23/18 1038]  Enc Vitals Group     BP 126/68     Pulse      Resp 16     Temp 98.4 F (36.9 C)     Temp Source Oral     SpO2 100 %     Weight      Height      Head Circumference      Peak Flow      Pain Score      Pain Loc      Pain Edu?      Excl. in GC?    No data found.  Updated Vital Signs BP 126/68 (BP Location: Right Arm)   Temp 98.4 F (36.9 C) (Oral)   Resp 16   LMP 04/09/2018   SpO2 100%   Visual Acuity Right Eye Distance:   Left Eye Distance:   Bilateral Distance:    Right Eye Near:   Left Eye Near:    Bilateral Near:     Physical Exam  Constitutional: She is oriented to person, place, and time. She  appears well-developed and well-nourished. She appears distressed.  Appears uncomfortable holding right side of head  HENT:  Head: Normocephalic and atraumatic.  Eyes: Pupils are equal, round, and reactive to light. Conjunctivae and EOM are normal.  Significant pain with shining light in right eye, no pain elicited with shining light in left eye.  Bilateral pupils with white vertical line visible with light  Neck: Neck supple.  Cardiovascular: Normal rate and regular rhythm.  No murmur heard. Pulmonary/Chest: Effort normal and breath sounds normal. No respiratory distress.  Abdominal: Soft. There is no tenderness.  Nontender to light and deep palpation  Musculoskeletal: She exhibits no edema.  Strength 5/5 and equal bilaterally at shoulders and hips  Neurological: She is alert and oriented to person,  place, and time.  Patient A&O x3, cranial nerves II-XII grossly intact, strength at shoulders, hips and knees 5/5, equal bilaterally, patellar and biceps reflex 2+ bilaterally.  Negative Romberg and Pronator Drift. Gait without abnormality.  Skin: Skin is warm and dry.  Psychiatric: She has a normal mood and affect.  Nursing note and vitals reviewed.    UC Treatments / Results  Labs (all labs ordered are listed, but only abnormal results are displayed) Labs Reviewed - No data to display  EKG None  Radiology No results found.  Procedures Procedures (including critical care time)  Medications Ordered in UC Medications  ketorolac (TORADOL) injection 60 mg (has no administration in time range)  metoCLOPramide (REGLAN) injection 5 mg (has no administration in time range)  dexamethasone (DECADRON) injection 10 mg (has no administration in time range)    Initial Impression / Assessment and Plan / UC Course  I have reviewed the triage vital signs and the nursing notes.  Pertinent labs & imaging results that were available during my care of the patient were reviewed by me and considered in my medical decision making (see chart for details).     Patient with migraine headache versus cluster.  Headache seems more migraine-like given photophobia and nonepisodic nature.  No focal neuro deficit.  Vital signs stable.  Will treat with Toradol, Decadron and Reglan.  Discussed with patient given this is not typical for her to get migraines, and to follow-up in emergency room if injections not improving her pain.  Or go to emergency room if developing changes in vision, weakness, changes in vision, dizziness, lightheadedness, persistent pain.Discussed strict return precautions. Patient verbalized understanding and is agreeable with plan.  Final Clinical Impressions(s) / UC Diagnoses   Final diagnoses:  Other migraine without status migrainosus, not intractable     Discharge Instructions      We gave you a shot of Toradol, Reglan and Decadron to treat your headache. They should kick in about 30 to 40 minutes.  At home please continue to use Aleve or ibuprofen 800 for your headaches.  Please continue to monitor your symptoms, if your headache is not improving with injections, developing vision changes, eye pain, weakness, debility speaking, dizziness or lightheadedness please return or go to emergency room for further treatment and evaluation.     ED Prescriptions    Medication Sig Dispense Auth. Provider   ibuprofen (ADVIL,MOTRIN) 800 MG tablet Take 1 tablet (800 mg total) by mouth 3 (three) times daily. 21 tablet Wieters, WarrenHallie C, PA-C     Controlled Substance Prescriptions [place] Controlled Substance Registry consulted? Not Applicable   Lew DawesWieters, Hallie C, [place]PA-C 04/23/18 1109

## 2018-04-23 NOTE — Discharge Instructions (Addendum)
We gave you a shot of Toradol, Reglan and Decadron to treat your headache. They should kick in about 30 to 40 minutes.  At home please continue to use Aleve or ibuprofen 800 for your headaches.  Please continue to monitor your symptoms, if your headache is not improving with injections, developing vision changes, eye pain, weakness, debility speaking, dizziness or lightheadedness please return or go to emergency room for further treatment and evaluation.

## 2018-05-05 ENCOUNTER — Other Ambulatory Visit: Payer: Self-pay | Admitting: Internal Medicine

## 2018-05-05 DIAGNOSIS — E049 Nontoxic goiter, unspecified: Secondary | ICD-10-CM

## 2018-05-13 ENCOUNTER — Other Ambulatory Visit: Payer: Self-pay | Admitting: Internal Medicine

## 2018-05-13 DIAGNOSIS — R519 Headache, unspecified: Secondary | ICD-10-CM

## 2018-05-13 DIAGNOSIS — R51 Headache: Principal | ICD-10-CM

## 2018-05-25 ENCOUNTER — Other Ambulatory Visit: Payer: Managed Care, Other (non HMO)

## 2018-12-07 ENCOUNTER — Encounter: Payer: Self-pay | Admitting: Internal Medicine

## 2018-12-07 ENCOUNTER — Other Ambulatory Visit: Payer: Self-pay

## 2018-12-07 ENCOUNTER — Ambulatory Visit: Payer: Managed Care, Other (non HMO) | Admitting: Internal Medicine

## 2018-12-07 ENCOUNTER — Other Ambulatory Visit (HOSPITAL_COMMUNITY)
Admission: RE | Admit: 2018-12-07 | Discharge: 2018-12-07 | Disposition: A | Discharge status: Home / Self Care | Payer: Managed Care, Other (non HMO) | Source: Ambulatory Visit | Attending: Internal Medicine | Admitting: Internal Medicine

## 2018-12-07 ENCOUNTER — Emergency Department (HOSPITAL_COMMUNITY)
Admission: EM | Admit: 2018-12-07 | Discharge: 2018-12-07 | Disposition: A | Discharge status: Home / Self Care | Payer: Managed Care, Other (non HMO) | Attending: Emergency Medicine | Admitting: Emergency Medicine

## 2018-12-07 ENCOUNTER — Encounter (HOSPITAL_COMMUNITY): Payer: Self-pay | Admitting: Internal Medicine

## 2018-12-07 VITALS — BP 110/66 | HR 70 | Temp 98.5°F | Ht 66.0 in | Wt 113.8 lb

## 2018-12-07 DIAGNOSIS — R21 Rash and other nonspecific skin eruption: Secondary | ICD-10-CM | POA: Diagnosis not present

## 2018-12-07 DIAGNOSIS — Z79899 Other long term (current) drug therapy: Secondary | ICD-10-CM | POA: Diagnosis not present

## 2018-12-07 DIAGNOSIS — Z113 Encounter for screening for infections with a predominantly sexual mode of transmission: Secondary | ICD-10-CM | POA: Insufficient documentation

## 2018-12-07 LAB — POCT URINALYSIS DIPSTICK
BILIRUBIN UA: NEGATIVE
Glucose, UA: NEGATIVE
KETONES UA: NEGATIVE
Nitrite, UA: NEGATIVE
PH UA: 6 (ref 5.0–8.0)
Protein, UA: NEGATIVE
RBC UA: NEGATIVE
UROBILINOGEN UA: 0.2 U/dL

## 2018-12-07 MED ORDER — LORATADINE 10 MG PO TABS: 10.0000 mg | ORAL_TABLET | Freq: Every day | ORAL | 0 refills | Status: AC

## 2018-12-07 MED ORDER — TRIAMCINOLONE ACETONIDE 0.1 % EX CREA: TOPICAL_CREAM | CUTANEOUS | 0 refills | Status: DC

## 2018-12-07 MED ORDER — HYDROCORTISONE 0.5 % EX OINT: 1.0000 "application " | TOPICAL_OINTMENT | Freq: Two times a day (BID) | CUTANEOUS | 0 refills | Status: DC

## 2018-12-07 NOTE — ED Triage Notes (Signed)
Pt here for evaluation of a rash that started approx 1 week ago on her arms. Over the past week pt reports rash has spread to abdomen and legs. Denies ingesting any new substances/allergies. Attempted to use coconut oil on rash to help with spreading without any relief.

## 2018-12-07 NOTE — Discharge Instructions (Addendum)
Please read attached information. If you experience any new or worsening signs or symptoms please return to the emergency room for evaluation. Please follow-up with your primary care provider or specialist as discussed. Please use medication prescribed only as directed and discontinue taking if you have any concerning signs or symptoms.   °

## 2018-12-07 NOTE — ED Provider Notes (Signed)
[facility] EMERGENCY DEPARTMENT Provider Note   CSN: [id number] Arrival date & time: 12/07/18  2778    History   Chief Complaint Chief Complaint  Patient presents with  . Rash    HPI Angelica Cummings is a 28 y.o. female.     HPI   28 year old female presents today with complaints of rash.  She notes approximate 1 week ago she developed a itchy rash to her lower legs that has spread to her upper extremities chest back and torso.  She denies any fever, denies any infectious symptoms.  She notes she had a identical rash several years ago that went away on its own.  No medications prior to arrival today.  No intraoral or palmar involvement.  She is sexually active, denies any vaginal discharge.      Past Medical History:  Diagnosis Date  . Hernia, umbilical     Patient Active Problem List   Diagnosis Date Noted  . Acute respiratory failure with hypoxia (HCC) 09/04/2013    History reviewed. No pertinent surgical history.   OB History   No obstetric history on file.      Home Medications    Prior to Admission medications   Medication Sig Start Date End Date Taking? Authorizing Provider  hydrocortisone ointment 0.5 % Apply 1 application topically 2 (two) times daily. 12/07/18   Arnaldo Heffron, Tinnie Gens, PA-C  ibuprofen (ADVIL,MOTRIN) 800 MG tablet Take 1 tablet (800 mg total) by mouth 3 (three) times daily. 04/23/18   Wieters, Hallie C, PA-C  loratadine (CLARITIN) 10 MG tablet Take 1 tablet (10 mg total) by mouth daily. 12/07/18   Shaindy Reader, Tinnie Gens, PA-C  triamcinolone cream (KENALOG) 0.1 % Apply lightly to 2-3 silvery skin patches x 7-10 days 12/07/18   Rodriguez-Southworth, Nettie Elm, PA-C    Family History No family history on file.  Social History Social History   Tobacco Use  . Smoking status: Never Smoker  . Smokeless tobacco: Never Used  Substance Use Topics  . Alcohol use: Yes    Comment: socially  . Drug use: No     Allergies   Patient has  no known allergies.   Review of Systems Review of Systems  All other systems reviewed and are negative.    Physical Exam Updated Vital Signs BP 111/71 (BP Location: Right Arm)   Pulse 90   Temp 97.8 F (36.6 C) (Oral)   Resp 17   Ht 5\' 6"  (1.676 m)   Wt 53.1 kg   LMP 11/21/2018   SpO2 100%   BMI 18.88 kg/m   Physical Exam Vitals signs and nursing note reviewed.  Constitutional:      Appearance: She is well-developed.  HENT:     Head: Normocephalic and atraumatic.  Eyes:     General: No scleral icterus.       Right eye: No discharge.        Left eye: No discharge.     Conjunctiva/sclera: Conjunctivae normal.     Pupils: Pupils are equal, round, and reactive to light.  Neck:     Musculoskeletal: Normal range of motion.     Vascular: No JVD.     Trachea: No tracheal deviation.  Pulmonary:     Effort: Pulmonary effort is normal.     Breath sounds: No stridor.  Skin:    Comments: Macular papular rash to the lower extremities upper extremities chest and back, no facial involvement, no oral involvement, no palmar involvement, no secondary bacterial infection, no pustules  Neurological:     Mental Status: She is alert and oriented to person, place, and time.     Coordination: Coordination normal.  Psychiatric:        Behavior: Behavior normal.        Thought Content: Thought content normal.        Judgment: Judgment normal.      ED Treatments / Results  Labs (all labs ordered are listed, but only abnormal results are displayed) Labs Reviewed  HIV ANTIBODY (ROUTINE TESTING W REFLEX)  RPR    EKG None  Radiology No results found.  Procedures Procedures (including critical care time)  Medications Ordered in ED Medications - No data to display   Initial Impression / Assessment and Plan / ED Course  I have reviewed the triage vital signs and the nursing notes.  Pertinent labs & imaging results that were available during my care of the patient were  reviewed by me and considered in my medical decision making (see chart for details).        Labs:   Imaging:  Consults:  Therapeutics:  Discharge Meds:   Assessment/Plan: 28 year old female presents today with rash.  She is afebrile well-appearing in no acute distress.  Identical to previous rashes, high suspicion for viral exanthem.  Patient will follow-up with her primary care, hydrocortisone as needed for itching, return precautions given.  Verbalized understanding and agreement to today's plan.    Final Clinical Impressions(s) / ED Diagnoses   Final diagnoses:  Rash    ED Discharge Orders         Ordered    hydrocortisone ointment 0.5 %  2 times daily     12/07/18 0927    loratadine (CLARITIN) 10 MG tablet  Daily     12/07/18 0927           Eyvonne Mechanic, PA-C 12/07/18 1317    Rolan Bucco, MD 12/07/18 [phone]

## 2018-12-07 NOTE — Progress Notes (Signed)
Subjective:     Patient ID: Angelica Cummings , female    DOB: 1991/05/08 , 28 y.o.   MRN: [id number]   Chief Complaint  Patient presents with  . Rash    2 weeks-itches-tender to touch-    HPI   Onset of rash on L ankle  Early this month  And spread to the rest of her body. She went  to ER today and had HIV and RPR draw there today. Wants other STD's tested. Has protected sex last month with partner.  No rash on palms or feet, no cold sores.  Rash is a little itchy, sore to touch it. Has something milder 4 years ago, and some topical cream helped. ER Dr Recommended her to take Claritin.   Past Medical History:  Diagnosis Date  . Hernia, umbilical      Current Outpatient Medications:  .  hydrocortisone ointment 0.5 %, Apply 1 application topically 2 (two) times daily., Disp: 30 g, Rfl: 0 .  ibuprofen (ADVIL,MOTRIN) 800 MG tablet, Take 1 tablet (800 mg total) by mouth 3 (three) times daily., Disp: 21 tablet, Rfl: 0 .  loratadine (CLARITIN) 10 MG tablet, Take 1 tablet (10 mg total) by mouth daily., Disp: 30 tablet, Rfl: 0   No Known Allergies   Review of Systems  Constitutional: Negative for chills, diaphoresis, fatigue and fever.  HENT: Negative for congestion.   Eyes: Negative for discharge.  Respiratory: Negative for chest tightness and shortness of breath.   Cardiovascular: Negative for chest pain.  Gastrointestinal: Negative for nausea.  Genitourinary: Negative for dysuria and vaginal discharge.  Musculoskeletal: Negative for gait problem and myalgias.  Skin: Positive for color change and rash. Negative for wound.  Hematological: Negative for adenopathy.     Today's Vitals   12/07/18 1047  BP: 110/66  Pulse: 70  Temp: 98.5 F (36.9 C)  TempSrc: Oral  SpO2: 98%  Weight: 113 lb 12.8 oz (51.6 kg)  Height: 5\' 6"  (1.676 m)   Body mass index is 18.37 kg/m.   Objective:  Physical Exam Vitals signs and nursing note reviewed.  Constitutional:      General: She is  not in acute distress.    Appearance: Normal appearance. She is normal weight. She is not toxic-appearing.  HENT:     Head: Normocephalic.     Right Ear: Tympanic membrane, ear canal and external ear normal.     Left Ear: Tympanic membrane, ear canal and external ear normal.     Nose: Nose normal.     Mouth/Throat:     Mouth: Mucous membranes are moist.  Eyes:     General: No scleral icterus.    Conjunctiva/sclera: Conjunctivae normal.  Neck:     Musculoskeletal: Neck supple.  Pulmonary:     Effort: Pulmonary effort is normal.  Musculoskeletal: Normal range of motion.  Lymphadenopathy:     Cervical: No cervical adenopathy.  Skin:    General: Skin is warm and dry.     Capillary Refill: Capillary refill takes less than 2 seconds.     Coloration: Skin is not jaundiced or pale.     Findings: Rash present.     Comments: Has different size pigmented oval dry patches from  1x1 cm to 1.5-2 cm. Some have silvery color scaling. None are tender. They seem to follow skin folds. None on palms.   Neurological:     Mental Status: She is alert and oriented to person, place, and time.     Motor:  No weakness.     Gait: Gait normal.  Psychiatric:        Mood and Affect: Mood normal.        Behavior: Behavior normal.        Thought Content: Thought content normal.        Judgment: Judgment normal.     Assessment And Plan:    1. Rash- possibly pytiriasis rosea vs some look like psoriasis. I asked her to apply triamcinolone cream bid to 3-4 lesion with silver color and let me know if this helps or not til she sees derm. If it helps and RPR is neg, may tray oral steroids.  - Ambulatory referral to Dermatology - POCT Urinalysis Dipstick (81002)  2. Screen for STD (sexually transmitted disease) - Urine cytology ancillary only- GC,Chlamydia, tric BV and candida test requested. We will call her with results.     Ramey Schiff RODRIGUEZ-SOUTHWORTH, PA-C

## 2018-12-07 NOTE — Patient Instructions (Signed)
Pityriasis Rosea  Pityriasis rosea is a rash that usually appears on the chest, abdomen, and back. It may also appear on the upper arms and upper legs. It usually begins as a single patch, and then more patches start to develop. The rash may cause mild itching, but it normally does not cause other problems. It usually goes away without treatment. However, it may take weeks or months for the rash to go away completely.  What are the causes?  The cause of this condition is not known. The condition does not spread from person to person (is not contagious).  What increases the risk?  This condition is more likely to develop in:  · Persons aged 10-35 years.  · Pregnant women.  It is more common in the spring and fall seasons.  What are the signs or symptoms?  The main symptom of this condition is a rash.  · The rash usually begins with a single oval patch that is larger than the ones that follow. This is called a herald patch. It generally appears a week or more before the rest of the rash appears.  · When more patches start to develop, they spread quickly on the chest, abdomen, back, arms, and legs. These patches are smaller than the first one.  · The patches that make up the rash are usually oval-shaped and pink or red in color. They are usually flat but may sometimes be raised so that they can be felt with a finger. They may also be finely crinkled and have a scaly ring around the edge.  Some people may have mild itching and nonspecific symptoms, such as:  · Nausea.  · Loss of appetite.  · Difficulty concentrating.  · Headache.  · Irritability.  · Sore throat.  · Mild fever.  How is this diagnosed?  This condition may be diagnosed based on:  · Your medical history and a physical exam.  · Tests to rule out other causes. This may include blood tests or a test in which a small sample of skin is removed from the rash (biopsy) and checked in a lab.  How is this treated?         Treatment is not usually needed for this  condition. The rash will often go away on its own in 4-8 weeks. In some cases, a health care provider may recommend or prescribe medicine to reduce itching.  Follow these instructions at home:  · Take or apply over-the-counter and prescription medicines only as told by your health care provider.  · Avoid scratching the affected areas of skin.  · Do not take hot baths or use a sauna. Use only warm water when bathing or showering. Heat can increase itching. Adding cornstarch to your bath may help to relieve the itching.  · Avoid exposure to the sun and other sources of UV light, such as tanning beds, as told by your health care provider. UV light may help the rash go away but may cause unwanted changes in skin color.  · Keep all follow-up visits as told by your health care provider. This is important.  Contact a health care provider if:  · Your rash does not go away in 8 weeks.  · Your rash gets much worse.  · You have a fever.  · You have swelling or pain in the rash area.  · You have fluid, blood, or pus coming from the rash area.  Summary  · Pityriasis rosea is a rash that   usually appears on the trunk of the body. It can also appear on the upper arms and upper legs.  · The rash usually begins with a single oval patch (herald patch) that appears a week or more before the rest of the rash appears. The herald patch is larger than the ones that follow.  · The rash may cause mild itching, but it usually does not cause other problems. It usually goes away without treatment in 4-8 weeks.  · In some cases, a health care provider may recommend or prescribe medicine to reduce itching.  This information is not intended to replace advice given to you by your health care provider. Make sure you discuss any questions you have with your health care provider.  Document Released: 11/05/2001 Document Revised: 09/28/2017 Document Reviewed: 09/28/2017  [employer]vier Interactive Patient Education © 2019 [employer]

## 2018-12-07 NOTE — ED Notes (Signed)
Declined W/C at D/C and was escorted to lobby by RN. 

## 2018-12-08 LAB — HIV ANTIBODY (ROUTINE TESTING W REFLEX): HIV Screen 4th Generation wRfx: NONREACTIVE

## 2018-12-08 LAB — RPR: RPR Ser Ql: NONREACTIVE

## 2018-12-09 ENCOUNTER — Other Ambulatory Visit: Payer: Self-pay | Admitting: Internal Medicine

## 2018-12-09 LAB — URINE CYTOLOGY ANCILLARY ONLY
Chlamydia: NEGATIVE
Neisseria Gonorrhea: NEGATIVE
TRICH (WINDOWPATH): POSITIVE — AB

## 2018-12-09 MED ORDER — METRONIDAZOLE 500 MG PO TABS: 500.0000 mg | ORAL_TABLET | Freq: Two times a day (BID) | ORAL | 0 refills | Status: AC

## 2018-12-10 LAB — URINE CYTOLOGY ANCILLARY ONLY: Candida vaginitis: NEGATIVE

## 2019-05-11 ENCOUNTER — Encounter: Payer: Self-pay | Admitting: Nurse Practitioner

## 2019-05-11 ENCOUNTER — Ambulatory Visit (INDEPENDENT_AMBULATORY_CARE_PROVIDER_SITE_OTHER): Payer: Managed Care, Other (non HMO) | Admitting: Nurse Practitioner

## 2019-05-11 ENCOUNTER — Encounter: Payer: Self-pay | Admitting: Internal Medicine

## 2019-05-11 ENCOUNTER — Other Ambulatory Visit: Payer: Self-pay

## 2019-05-11 VITALS — BP 102/70 | HR 69 | Temp 98.1°F | Ht 65.4 in | Wt 111.8 lb

## 2019-05-11 DIAGNOSIS — Z0000 Encounter for general adult medical examination without abnormal findings: Secondary | ICD-10-CM | POA: Diagnosis not present

## 2019-05-11 DIAGNOSIS — Z681 Body mass index (BMI) 19 or less, adult: Secondary | ICD-10-CM

## 2019-05-11 DIAGNOSIS — R6889 Other general symptoms and signs: Secondary | ICD-10-CM | POA: Insufficient documentation

## 2019-05-11 LAB — POCT URINALYSIS DIPSTICK
Blood, UA: NEGATIVE
Glucose, UA: NEGATIVE
Ketones, UA: NEGATIVE
Leukocytes, UA: NEGATIVE
Nitrite, UA: NEGATIVE
Protein, UA: POSITIVE — AB
Spec Grav, UA: 1.025 (ref 1.010–1.025)
Urobilinogen, UA: 0.2 E.U./dL
pH, UA: 5.5 (ref 5.0–8.0)

## 2019-05-11 NOTE — Patient Instructions (Signed)
Health Maintenance, Female Adopting a healthy lifestyle and getting preventive care are important in promoting health and wellness. Ask your health care provider about:  The right schedule for you to have regular tests and exams.  Things you can do on your own to prevent diseases and keep yourself healthy. What should I know about diet, weight, and exercise? Eat a healthy diet   Eat a diet that includes plenty of vegetables, fruits, low-fat dairy products, and lean protein.  Do not eat a lot of foods that are high in solid fats, added sugars, or sodium. Maintain a healthy weight Body mass index (BMI) is used to identify weight problems. It estimates body fat based on height and weight. Your health care provider can help determine your BMI and help you achieve or maintain a healthy weight. Get regular exercise Get regular exercise. This is one of the most important things you can do for your health. Most adults should:  Exercise for at least 150 minutes each week. The exercise should increase your heart rate and make you sweat (moderate-intensity exercise).  Do strengthening exercises at least twice a week. This is in addition to the moderate-intensity exercise.  Spend less time sitting. Even light physical activity can be beneficial. Watch cholesterol and blood lipids Have your blood tested for lipids and cholesterol at 30 years of age, then have this test every 5 years. Have your cholesterol levels checked more often if:  Your lipid or cholesterol levels are high.  You are older than 30 years of age.  You are at high risk for heart disease. What should I know about cancer screening? Depending on your health history and family history, you may need to have cancer screening at various ages. This may include screening for:  Breast cancer.  Cervical cancer.  Colorectal cancer.  Skin cancer.  Lung cancer. What should I know about heart disease, diabetes, and high blood  pressure? Blood pressure and heart disease  High blood pressure causes heart disease and increases the risk of stroke. This is more likely to develop in people who have high blood pressure readings, are of African descent, or are overweight.  Have your blood pressure checked: ? Every 3-5 years if you are 18-39 years of age. ? Every year if you are 40 years old or older. Diabetes Have regular diabetes screenings. This checks your fasting blood sugar level. Have the screening done:  Once every three years after age 40 if you are at a normal weight and have a low risk for diabetes.  More often and at a younger age if you are overweight or have a high risk for diabetes. What should I know about preventing infection? Hepatitis B If you have a higher risk for hepatitis B, you should be screened for this virus. Talk with your health care provider to find out if you are at risk for hepatitis B infection. Hepatitis C Testing is recommended for:  Everyone born from 1945 through 1965.  Anyone with known risk factors for hepatitis C. Sexually transmitted infections (STIs)  Get screened for STIs, including gonorrhea and chlamydia, if: ? You are sexually active and are younger than 30 years of age. ? You are older than 30 years of age and your health care provider tells you that you are at risk for this type of infection. ? Your sexual activity has changed since you were last screened, and you are at increased risk for chlamydia or gonorrhea. Ask your health care provider if   you are at risk.  Ask your health care provider about whether you are at high risk for HIV. Your health care provider may recommend a prescription medicine to help prevent HIV infection. If you choose to take medicine to prevent HIV, you should first get tested for HIV. You should then be tested every 3 months for as long as you are taking the medicine. Pregnancy  If you are about to stop having your period (premenopausal) and  you may become pregnant, seek counseling before you get pregnant.  Take 400 to 800 micrograms (mcg) of folic acid every day if you become pregnant.  Ask for birth control (contraception) if you want to prevent pregnancy. Osteoporosis and menopause Osteoporosis is a disease in which the bones lose minerals and strength with aging. This can result in bone fractures. If you are 65 years old or older, or if you are at risk for osteoporosis and fractures, ask your health care provider if you should:  Be screened for bone loss.  Take a calcium or vitamin D supplement to lower your risk of fractures.  Be given hormone replacement therapy (HRT) to treat symptoms of menopause. Follow these instructions at home: Lifestyle  Do not use any products that contain nicotine or tobacco, such as cigarettes, e-cigarettes, and chewing tobacco. If you need help quitting, ask your health care provider.  Do not use street drugs.  Do not share needles.  Ask your health care provider for help if you need support or information about quitting drugs. Alcohol use  Do not drink alcohol if: ? Your health care provider tells you not to drink. ? You are pregnant, may be pregnant, or are planning to become pregnant.  If you drink alcohol: ? Limit how much you use to 0-1 drink a day. ? Limit intake if you are breastfeeding.  Be aware of how much alcohol is in your drink. In the [place] one drink equals one 12 oz bottle of beer (355 mL), one 5 oz glass of wine (148 mL), or one 1 oz glass of hard liquor (44 mL). General instructions  Schedule regular health, dental, and eye exams.  Stay current with your vaccines.  Tell your health care provider if: ? You often feel depressed. ? You have ever been abused or do not feel safe at home. Summary  Adopting a healthy lifestyle and getting preventive care are important in promoting health and wellness.  Follow your health care provider's instructions about healthy  diet, exercising, and getting tested or screened for diseases.  Follow your health care provider's instructions on monitoring your cholesterol and blood pressure. This information is not intended to replace advice given to you by your health care provider. Make sure you discuss any questions you have with your health care provider. Document Released: 04/14/2011 Document Revised: 09/22/2018 Document Reviewed: 09/22/2018 Elsevier Patient Education  2020 [employer]  

## 2019-05-11 NOTE — Progress Notes (Signed)
Subjective:     Patient ID: Angelica Cummings , female    DOB: 29-May-1991 , 30 y.o.   MRN: [id number]   Chief Complaint  Patient presents with  . Annual Exam   The patient states she uses none for birth control. Last LMP was Patient's last menstrual period was 04/14/2019.. Negative for Dysmenorrhea and Negative for Menorrhagia Negative for: breast discharge, breast lump(s), breast pain and breast self exam.  Pertinent negatives include abnormal bleeding (hematology), anxiety, decreased libido, depression, difficulty falling sleep, dyspareunia, history of infertility, nocturia, sexual dysfunction, sleep disturbances, urinary incontinence, urinary urgency, vaginal discharge and vaginal itching. Diet regular. The patient states her exercise level is  works in [employer] feels like this is her exercise.      The patient's tobacco use is:  Social History   Tobacco Use  Smoking Status Never Smoker  Smokeless Tobacco Never Used   She has been exposed to passive smoke. The patient's alcohol use is:  Social History   Substance and Sexual Activity  Alcohol Use Yes   Comment: socially   Additional information[facility]  HPI  Here for HM     Past Medical History:  Diagnosis Date  . Hernia, umbilical      No family history on file.   Current Outpatient Medications:  .  ibuprofen (ADVIL,MOTRIN) 800 MG tablet, Take 1 tablet (800 mg total) by mouth 3 (three) times daily., Disp: 21 tablet, Rfl: 0 .  loratadine (CLARITIN) 10 MG tablet, Take 1 tablet (10 mg total) by mouth daily., Disp: 30 tablet, Rfl: 0 .  hydrocortisone ointment 0.5 %, Apply 1 application topically 2 (two) times daily. (Patient not taking: Reported on 05/11/2019), Disp: 30 g, Rfl: 0 .  triamcinolone cream (KENALOG) 0.1 %, Apply lightly to 2-3 silvery skin patches x 7-10 days (Patient not taking: Reported on 05/11/2019), Disp: 80 g, Rfl: 0   No Known Allergies   Review of Systems   Constitutional: Negative.   HENT: Negative.   Eyes: Negative.   Respiratory: Negative.   Cardiovascular: Negative.  Negative for chest pain, palpitations and leg swelling.  Gastrointestinal: Negative.   Endocrine: Negative.   Genitourinary: Negative.   Musculoskeletal: Negative.   Skin: Negative.   Allergic/Immunologic: Negative.   Neurological: Negative.   Hematological: Negative.   Psychiatric/Behavioral: Negative.      Today's Vitals   05/11/19 0947  BP: 102/70  Pulse: 69  Temp: 98.1 F (36.7 C)  TempSrc: Oral  Weight: 111 lb 12.8 oz (50.7 kg)  Height: 5' 5.4" (1.661 m)  PainSc: 0-No pain   Body mass index is 18.38 kg/m.   Objective:  Physical Exam Vitals signs reviewed.  Constitutional:      Appearance: Normal appearance. She is well-developed.  HENT:     Head: Normocephalic and atraumatic.     Right Ear: Hearing, tympanic membrane, ear canal and external ear normal.     Left Ear: Hearing, tympanic membrane, ear canal and external ear normal.  Eyes:     General: Lids are normal.     Extraocular Movements: Extraocular movements intact.     Conjunctiva/sclera: Conjunctivae normal.     Pupils: Pupils are equal, round, and reactive to light.     Funduscopic exam:    Right eye: No papilledema.        Left eye: No papilledema.  Neck:     Musculoskeletal: Full passive range of motion without pain, normal range of motion and neck supple.  Thyroid: No thyroid mass.     Vascular: No carotid bruit.  Cardiovascular:     Rate and Rhythm: Normal rate and regular rhythm.     Pulses: Normal pulses.     Heart sounds: Normal heart sounds. No murmur.  Pulmonary:     Effort: Pulmonary effort is normal. No respiratory distress.     Breath sounds: Normal breath sounds.  Chest:     Breasts:        Right: Normal. No mass, nipple discharge or tenderness.        Left: Normal. No mass, nipple discharge or tenderness.  Abdominal:     General: Abdomen is flat. Bowel sounds  are normal.     Palpations: Abdomen is soft.  Musculoskeletal: Normal range of motion.        General: No swelling.     Right lower leg: No edema.     Left lower leg: No edema.  Skin:    General: Skin is warm and dry.     Capillary Refill: Capillary refill takes less than 2 seconds.  Neurological:     General: No focal deficit present.     Mental Status: She is alert and oriented to person, place, and time.     Cranial Nerves: No cranial nerve deficit.     Sensory: No sensory deficit.  Psychiatric:        Mood and Affect: Mood normal.        Behavior: Behavior normal.        Thought Content: Thought content normal.        Judgment: Judgment normal.         Assessment And Plan:     1. Encounter for general adult medical examination w/o abnormal findings . Behavior modifications discussed and diet history reviewed.   . Pt will continue to exercise regularly and modify diet with low GI, plant based foods and decrease intake of processed foods.  . Recommend intake of daily multivitamin, Vitamin D, and calcium.  . Recommend for preventive screenings, as well as recommend immunizations that include  TDAP (she is to find out where she received and try to get a copy) . Discussed doing monthly SBE - POCT Urinalysis Dipstick (81002)   2. Fluctuation of weight  She reports gaining weight then losing often  BMI is 18.38  Will check thyroid levels   [name], FNP    THE PATIENT IS ENCOURAGED TO PRACTICE SOCIAL DISTANCING DUE TO THE COVID-19 PANDEMIC.

## 2019-06-02 ENCOUNTER — Telehealth: Payer: Self-pay

## 2019-06-02 NOTE — Telephone Encounter (Signed)
Left message. Need to know why pt missed appt at [facility] dermatology

## 2019-11-17 ENCOUNTER — Ambulatory Visit: Payer: Managed Care, Other (non HMO) | Admitting: Internal Medicine

## 2019-11-22 ENCOUNTER — Encounter: Payer: Self-pay | Admitting: Nurse Practitioner

## 2019-11-22 ENCOUNTER — Other Ambulatory Visit (HOSPITAL_COMMUNITY)
Admission: RE | Admit: 2019-11-22 | Discharge: 2019-11-22 | Disposition: A | Discharge status: Home / Self Care | Payer: Commercial Managed Care - HMO | Source: Ambulatory Visit | Attending: Nurse Practitioner | Admitting: Nurse Practitioner

## 2019-11-22 ENCOUNTER — Other Ambulatory Visit: Payer: Self-pay

## 2019-11-22 ENCOUNTER — Ambulatory Visit: Payer: Medicaid Other | Admitting: Nurse Practitioner

## 2019-11-22 VITALS — BP 118/78 | HR 68 | Temp 97.9°F | Ht 66.0 in | Wt 117.4 lb

## 2019-11-22 DIAGNOSIS — R10815 Periumbilic abdominal tenderness: Secondary | ICD-10-CM

## 2019-11-22 LAB — POCT URINALYSIS DIPSTICK
Bilirubin, UA: NEGATIVE
Blood, UA: NEGATIVE
Glucose, UA: NEGATIVE
Ketones, UA: NEGATIVE
Nitrite, UA: NEGATIVE
Protein, UA: NEGATIVE
Spec Grav, UA: >=1.03 — AB (ref 1.010–1.025)
Urobilinogen, UA: 0.2 E.U./dL
pH, UA: 5.5 (ref 5.0–8.0)

## 2019-11-22 NOTE — Progress Notes (Signed)
This visit occurred during the SARS-CoV-2 public health emergency.  Safety protocols were in place, including screening questions prior to the visit, additional usage of staff PPE, and extensive cleaning of exam room while observing appropriate contact time as indicated for disinfecting solutions.  Subjective:     Patient ID: Angelica Cummings , female    DOB: [birth year] , 29 y.o.   MRN: [id number]   Chief Complaint  Patient presents with  . Abdominal Pain    HPI  Abdominal Pain This is a new problem. The current episode started in the past 7 days (Friday morning). The onset quality is sudden. The problem occurs intermittently. The problem has been unchanged. Pain location: midabdomen. The pain is at a severity of 7/10. The pain is moderate. The quality of the pain is cramping. The abdominal pain does not radiate. Pertinent negatives include no belching, constipation, diarrhea, dysuria, fever, frequency, headaches, nausea or vomiting. Associated symptoms comments: LMP - November 07, 2019 regular. Nothing aggravates the pain. Relieved by: drinking cranberry juice. There is no history of colon cancer.     Past Medical History:  Diagnosis Date  . Hernia, umbilical      No family history on file.   Current Outpatient Medications:  .  loratadine (CLARITIN) 10 MG tablet, Take 1 tablet (10 mg total) by mouth daily., Disp: 30 tablet, Rfl: 0   No Known Allergies   Review of Systems  Constitutional: Negative for fatigue and fever.  Gastrointestinal: Positive for abdominal pain. Negative for abdominal distention, constipation, diarrhea, nausea and vomiting.  Endocrine: Negative for polydipsia, polyphagia and polyuria.  Genitourinary: Negative for dysuria and frequency.  Neurological: Negative for headaches.     Today's Vitals   11/22/19 0854  BP: 118/78  Pulse: 68  Temp: 97.9 F (36.6 C)  TempSrc: Oral  Weight: 117 lb 6.4 oz (53.3 kg)  Height: 5\' 6"  (1.676 m)   Body mass index is  18.95 kg/m.   Objective:  Physical Exam Vitals reviewed.  Constitutional:      General: She is not in acute distress.    Appearance: She is well-developed.  Cardiovascular:     Rate and Rhythm: Normal rate and regular rhythm.     Heart sounds: Normal heart sounds. No murmur.  Pulmonary:     Effort: Pulmonary effort is normal. No respiratory distress.     Breath sounds: Normal breath sounds.  Abdominal:     General: Bowel sounds are normal.     Palpations: Abdomen is soft. There is no shifting dullness or mass.     Tenderness: There is abdominal tenderness in the suprapubic area. Negative signs include Murphy's sign and McBurney's sign.     Hernia: No hernia is present.  Skin:    General: Skin is warm and dry.     Capillary Refill: Capillary refill takes less than 2 seconds.  Neurological:     General: No focal deficit present.     Mental Status: She is alert and oriented to person, place, and time.     Cranial Nerves: No cranial nerve deficit.  Psychiatric:        Mood and Affect: Mood normal.        Behavior: Behavior normal.         Assessment And Plan:     1. Periumbilical abdominal tenderness without rebound tenderness  Urinalysis is normal  Mild tenderness to suprapubic area  Will check for STDs as well.   Encouraged to take a probiotic daily to  see if effective.     [name], FNP    THE PATIENT IS ENCOURAGED TO PRACTICE SOCIAL DISTANCING DUE TO THE COVID-19 PANDEMIC.

## 2019-11-25 LAB — URINE CYTOLOGY ANCILLARY ONLY
Bacterial Vaginitis-Urine: NEGATIVE
Candida Urine: NEGATIVE
Chlamydia: NEGATIVE
Comment: NEGATIVE
Comment: NORMAL
Neisseria Gonorrhea: POSITIVE — AB

## 2019-11-28 ENCOUNTER — Other Ambulatory Visit: Payer: Self-pay | Admitting: Nurse Practitioner

## 2019-11-28 DIAGNOSIS — A549 Gonococcal infection, unspecified: Secondary | ICD-10-CM

## 2019-11-28 MED ORDER — AZITHROMYCIN 250 MG PO TABS: ORAL_TABLET | ORAL | 0 refills | Status: AC

## 2019-12-07 ENCOUNTER — Telehealth: Payer: Self-pay

## 2019-12-07 NOTE — Telephone Encounter (Signed)
Leeanne Mannan with [facility] received a fax regarding patient's positive test results and she would like to verify what med the patient was treated with. [phone]  I have returned her call and left her a v/m to call the office. YRL,RMA

## 2019-12-12 ENCOUNTER — Ambulatory Visit (INDEPENDENT_AMBULATORY_CARE_PROVIDER_SITE_OTHER): Payer: Medicaid Other

## 2019-12-12 ENCOUNTER — Other Ambulatory Visit: Payer: Self-pay

## 2019-12-12 VITALS — BP 124/82 | HR 65 | Temp 97.8°F | Ht 66.0 in | Wt 118.0 lb

## 2019-12-12 DIAGNOSIS — A549 Gonococcal infection, unspecified: Secondary | ICD-10-CM | POA: Diagnosis not present

## 2019-12-12 MED ORDER — CEFTRIAXONE SODIUM 1 G IJ SOLR
1.0000 g | Freq: Once | INTRAMUSCULAR | Status: AC
  Administered 2019-12-12: 1 g via INTRAMUSCULAR

## 2019-12-12 NOTE — Progress Notes (Signed)
Patient was seen today for treatment of Gonorrhea , side effects explained patient understood.

## 2020-05-16 ENCOUNTER — Encounter: Payer: Managed Care, Other (non HMO) | Admitting: Nurse Practitioner

## 2020-07-04 ENCOUNTER — Encounter: Payer: Medicaid Other | Admitting: Nurse Practitioner
